# Patient Record
Sex: Female | Born: 1980
Health system: Southern US, Community
[De-identification: ages and names within clinical notes are randomized; demographics above are authoritative.]

## PROBLEM LIST (undated history)

## (undated) DIAGNOSIS — G7000 Myasthenia gravis without (acute) exacerbation: Secondary | ICD-10-CM

## (undated) DIAGNOSIS — K219 Gastro-esophageal reflux disease without esophagitis: Secondary | ICD-10-CM

## (undated) HISTORY — DX: Myasthenia gravis without (acute) exacerbation: G70.00

---
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
## 2006-10-11 ENCOUNTER — Emergency Department: Payer: Self-pay | Admitting: Emergency Medicine

## 2010-06-10 ENCOUNTER — Ambulatory Visit: Payer: Self-pay | Admitting: Obstetrics and Gynecology

## 2011-11-22 DIAGNOSIS — Z1159 Encounter for screening for other viral diseases: Secondary | ICD-10-CM | POA: Diagnosis not present

## 2011-11-22 DIAGNOSIS — Z113 Encounter for screening for infections with a predominantly sexual mode of transmission: Secondary | ICD-10-CM | POA: Diagnosis not present

## 2012-03-01 DIAGNOSIS — Z113 Encounter for screening for infections with a predominantly sexual mode of transmission: Secondary | ICD-10-CM | POA: Diagnosis not present

## 2012-10-15 DIAGNOSIS — R300 Dysuria: Secondary | ICD-10-CM | POA: Diagnosis not present

## 2012-10-15 DIAGNOSIS — L259 Unspecified contact dermatitis, unspecified cause: Secondary | ICD-10-CM | POA: Diagnosis not present

## 2013-04-10 DIAGNOSIS — Z113 Encounter for screening for infections with a predominantly sexual mode of transmission: Secondary | ICD-10-CM | POA: Diagnosis not present

## 2013-06-10 DIAGNOSIS — Z23 Encounter for immunization: Secondary | ICD-10-CM | POA: Diagnosis not present

## 2013-06-10 DIAGNOSIS — E559 Vitamin D deficiency, unspecified: Secondary | ICD-10-CM | POA: Diagnosis not present

## 2013-06-10 DIAGNOSIS — E049 Nontoxic goiter, unspecified: Secondary | ICD-10-CM | POA: Diagnosis not present

## 2013-06-10 DIAGNOSIS — Z131 Encounter for screening for diabetes mellitus: Secondary | ICD-10-CM | POA: Diagnosis not present

## 2013-06-10 DIAGNOSIS — G7000 Myasthenia gravis without (acute) exacerbation: Secondary | ICD-10-CM | POA: Diagnosis not present

## 2013-06-10 DIAGNOSIS — E785 Hyperlipidemia, unspecified: Secondary | ICD-10-CM | POA: Diagnosis not present

## 2014-05-20 DIAGNOSIS — Z713 Dietary counseling and surveillance: Secondary | ICD-10-CM | POA: Diagnosis not present

## 2014-05-20 DIAGNOSIS — Z23 Encounter for immunization: Secondary | ICD-10-CM | POA: Diagnosis not present

## 2014-05-20 DIAGNOSIS — E669 Obesity, unspecified: Secondary | ICD-10-CM | POA: Diagnosis not present

## 2014-05-20 DIAGNOSIS — L259 Unspecified contact dermatitis, unspecified cause: Secondary | ICD-10-CM | POA: Diagnosis not present

## 2014-08-25 DIAGNOSIS — Z114 Encounter for screening for human immunodeficiency virus [HIV]: Secondary | ICD-10-CM | POA: Diagnosis not present

## 2014-08-25 DIAGNOSIS — Z01419 Encounter for gynecological examination (general) (routine) without abnormal findings: Secondary | ICD-10-CM | POA: Diagnosis not present

## 2014-08-25 DIAGNOSIS — Z3042 Encounter for surveillance of injectable contraceptive: Secondary | ICD-10-CM | POA: Diagnosis not present

## 2014-08-25 DIAGNOSIS — Z3009 Encounter for other general counseling and advice on contraception: Secondary | ICD-10-CM | POA: Diagnosis not present

## 2015-03-16 DIAGNOSIS — Z3009 Encounter for other general counseling and advice on contraception: Secondary | ICD-10-CM | POA: Diagnosis not present

## 2015-03-16 DIAGNOSIS — Z114 Encounter for screening for human immunodeficiency virus [HIV]: Secondary | ICD-10-CM | POA: Diagnosis not present

## 2016-01-10 DIAGNOSIS — E669 Obesity, unspecified: Secondary | ICD-10-CM | POA: Insufficient documentation

## 2016-07-26 DIAGNOSIS — Z62810 Personal history of physical and sexual abuse in childhood: Secondary | ICD-10-CM

## 2016-07-26 HISTORY — DX: Personal history of physical and sexual abuse in childhood: Z62.810

## 2016-08-08 ENCOUNTER — Ambulatory Visit: Payer: Self-pay | Admitting: Family Medicine

## 2016-08-14 ENCOUNTER — Ambulatory Visit: Payer: Self-pay | Admitting: Family Medicine

## 2016-09-06 ENCOUNTER — Encounter: Payer: Self-pay | Admitting: Family Medicine

## 2016-09-06 ENCOUNTER — Ambulatory Visit (INDEPENDENT_AMBULATORY_CARE_PROVIDER_SITE_OTHER): Payer: Medicaid Other | Admitting: Family Medicine

## 2016-09-07 NOTE — Progress Notes (Signed)
Insurance not valid, patient was not seen

## 2016-10-10 ENCOUNTER — Encounter: Payer: Self-pay | Admitting: Family Medicine

## 2016-10-10 ENCOUNTER — Ambulatory Visit (INDEPENDENT_AMBULATORY_CARE_PROVIDER_SITE_OTHER): Payer: Medicare Other | Admitting: Family Medicine

## 2016-10-10 VITALS — BP 122/74 | HR 94 | Temp 98.3°F | Resp 16 | Ht 64.25 in | Wt 214.7 lb

## 2016-10-10 DIAGNOSIS — G7000 Myasthenia gravis without (acute) exacerbation: Secondary | ICD-10-CM | POA: Insufficient documentation

## 2016-10-10 DIAGNOSIS — M2142 Flat foot [pes planus] (acquired), left foot: Secondary | ICD-10-CM | POA: Diagnosis not present

## 2016-10-10 DIAGNOSIS — L308 Other specified dermatitis: Secondary | ICD-10-CM | POA: Diagnosis not present

## 2016-10-10 DIAGNOSIS — L309 Dermatitis, unspecified: Secondary | ICD-10-CM | POA: Insufficient documentation

## 2016-10-10 DIAGNOSIS — M2141 Flat foot [pes planus] (acquired), right foot: Secondary | ICD-10-CM

## 2016-10-10 DIAGNOSIS — M79672 Pain in left foot: Secondary | ICD-10-CM

## 2016-10-10 DIAGNOSIS — M79671 Pain in right foot: Secondary | ICD-10-CM | POA: Diagnosis not present

## 2016-10-10 MED ORDER — TRIAMCINOLONE ACETONIDE 0.1 % EX CREA: 1.0000 "application " | TOPICAL_CREAM | Freq: Two times a day (BID) | CUTANEOUS | 0 refills | Status: DC

## 2016-10-10 NOTE — Progress Notes (Signed)
Name: Raven Ray   MRN: [id number]    DOB: Aug 20, 1981   Date:10/10/2016       Progress Note  Subjective  Chief Complaint  Chief Complaint  Patient presents with  . Rash    Patient needs refill on Triamcinolone Acetonide due to sensitive skin and will help her rash go away.   . Nose Problem    Onset- 3 days, right nostril has a sore that is tender and feels like it blocks her nasal cannel and wanted looked on. Patient has had a fever and head cold and seems to get these sores every year.  . Pruritis    Inbetween bilateral thigh, itchy and has a dark spot on them.    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   Myasthenia: she is doing well, still gets tired at the end of the day, and not working full time, she is trying to work about 6 hours per day.   Nose scab: she states that every time she has a cold, she develops a scab on right nostril, it clears with vaseline. She had a cold, but symptoms are better since she took mucinex.    Patient Active Problem List   Diagnosis Date Noted  . Myasthenia gravis (HCC) 10/10/2016  . Eczema 10/10/2016    Past Surgical History:  Procedure Laterality Date  . CESAREAN SECTION  10/10/1999  . TOTAL THYMECTOMY  1991    Family History  Problem Relation Age of Onset  . Hypertension Mother   . Hypertension Father   . Diabetes Maternal Grandmother     Social History   Social History  . Marital status: Single    Spouse name: N/A  . Number of children: 2  . Years of education: N/A   Occupational History  . Not on file.   Social History Main Topics  . Smoking status: Never Smoker  . Smokeless tobacco: Never Used  . Alcohol use No  . Drug use: No  . Sexual activity: No   Other Topics Concern  . Not on file   Social History Narrative   She is on disability but she states she went back to work part time to not feel disabled      Current Outpatient Prescriptions:  .  triamcinolone cream (KENALOG) 0.1 %, Apply 1 application topically 2 (two) times daily., Disp: 453.6 g, Rfl: 0  No Known Allergies   ROS  Ten systems reviewed and is negative except as mentioned in HPI   Objective  Vitals:   10/10/16 1147  BP: 122/74  Pulse: 94  Resp: 16  Temp: 98.3 F (36.8 C)  TempSrc: Oral  SpO2: 98%  Weight: 214 lb 11.2 oz (97.4 kg)  Height: 5' 4.25" (1.632 m)    Body mass index is 36.57 kg/m.  Physical Exam  Constitutional: Patient appears well-developed and well-nourished. Obese  No distress.  HEENT: head atraumatic, normocephalic, pupils equal and reactive to light,neck supple, throat within normal limits, nose shows a small scab on right outer nostril Cardiovascular: Normal rate, regular rhythm and normal heart sounds.  No murmur heard. No BLE edema. Pulmonary/Chest: Effort normal and breath sounds normal. No respiratory distress. Abdominal: Soft.  There is no tenderness. Psychiatric: Patient has a normal mood and affect. behavior is normal. Judgment and thought content normal.  PHQ2/9: [facility] 09/06/2016  Decreased  Interest 0  Down, Depressed, Hopeless 0  PHQ - 2 Score 0     Fall Risk: Fall Risk  09/06/2016  Falls in the past year? No    Assessment & Plan  1. Flat feet, bilateral  - Ambulatory referral to Podiatry  2. Bilateral foot pain  - Ambulatory referral to Podiatry  3. Myasthenia gravis [facility])  Not seeing neurologist at this time  4. Other eczema  She needs to resume moisturizer.  - triamcinolone cream (KENALOG) 0.1 %; Apply 1 application topically 2 (two) times daily.  Dispense: 453.6 g; Refill: 0

## 2016-10-20 ENCOUNTER — Encounter: Payer: Self-pay | Admitting: Family Medicine

## 2016-10-20 ENCOUNTER — Ambulatory Visit
Admission: RE | Admit: 2016-10-20 | Discharge: 2016-10-20 | Disposition: A | Discharge status: Home / Self Care | Payer: Medicare Other | Source: Ambulatory Visit | Attending: Family Medicine | Admitting: Family Medicine

## 2016-10-20 ENCOUNTER — Ambulatory Visit (INDEPENDENT_AMBULATORY_CARE_PROVIDER_SITE_OTHER): Payer: Medicare Other | Admitting: Family Medicine

## 2016-10-20 VITALS — BP 114/76 | HR 106 | Temp 98.8°F | Resp 18 | Ht 64.0 in | Wt 212.6 lb

## 2016-10-20 DIAGNOSIS — R197 Diarrhea, unspecified: Secondary | ICD-10-CM

## 2016-10-20 DIAGNOSIS — R109 Unspecified abdominal pain: Secondary | ICD-10-CM | POA: Diagnosis not present

## 2016-10-20 DIAGNOSIS — R112 Nausea with vomiting, unspecified: Secondary | ICD-10-CM

## 2016-10-20 DIAGNOSIS — R1031 Right lower quadrant pain: Secondary | ICD-10-CM

## 2016-10-20 DIAGNOSIS — R1033 Periumbilical pain: Secondary | ICD-10-CM | POA: Diagnosis not present

## 2016-10-20 DIAGNOSIS — R1110 Vomiting, unspecified: Secondary | ICD-10-CM | POA: Diagnosis not present

## 2016-10-20 LAB — POCT URINE PREGNANCY: Preg Test, Ur: NEGATIVE

## 2016-10-20 MED ORDER — IOPAMIDOL (ISOVUE-300) INJECTION 61%
100.0000 mL | Freq: Once | INTRAVENOUS | Status: AC | PRN
  Administered 2016-10-20: 100 mL via INTRAVENOUS

## 2016-10-20 MED ORDER — HYOSCYAMINE SULFATE 0.125 MG PO TABS: 0.1250 mg | ORAL_TABLET | ORAL | 0 refills | Status: DC | PRN

## 2016-10-20 MED ORDER — ONDANSETRON HCL 4 MG PO TABS: 4.0000 mg | ORAL_TABLET | Freq: Three times a day (TID) | ORAL | 0 refills | Status: DC | PRN

## 2016-10-20 NOTE — Progress Notes (Signed)
Name: Raven Ray   MRN: [id number]    DOB: [birth year]   Date:10/20/2016       Progress Note  Subjective  Chief Complaint  Chief Complaint  Patient presents with  . 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 Emesis    Abdominal pain in the middle region-sharp, fatigue, bloated.     HPI  N/V/D: she was at work last night ( [employer]) and developed acute onset of peri-umbilical pain, bloating, followed by vomiting, nausea ( any smell of food was making her nausea ) and multiple episodes of watery stools, at least every 10 minutes during the night. Foul odor. Also had chills last night. Vomiting has resolved, diarrhea is slowing down, she has no appetite for the past 36 hours, and has no desire to drink fluids. Last meal was 2 days ago. She still has her gallbladder and appendix. Pain is 7/10, but was worse last night   Patient Active Problem List   Diagnosis Date Noted  . Myasthenia gravis (HCC) 10/10/2016  . Eczema 10/10/2016    Past Surgical History:  Procedure Laterality Date  . CESAREAN SECTION  10/10/1999  . TOTAL THYMECTOMY  1991    Family History  Problem Relation Age of Onset  . Hypertension Mother   . Hypertension Father   . Diabetes Maternal Grandmother     Social History   Social History  . Marital status: Single    Spouse name: N/A  . Number of children: 2  . Years of education: N/A   Occupational History  . Not on file.   Social History Main Topics  . Smoking status: Never Smoker  . Smokeless tobacco: Never Used  . Alcohol use No  . Drug use: No  . Sexual activity: No   Other Topics Concern  . Not on file   Social History Narrative   She is on disability but she states she went back to work part time to not feel disabled     Current Outpatient Prescriptions:  .  triamcinolone cream (KENALOG) 0.1 %, Apply 1 application topically 2 (two) times daily., Disp: 453.6 g, Rfl: 0  No Known  Allergies   ROS  Ten systems reviewed and is negative except as mentioned in HPI   Objective  Vitals:   10/20/16 1434  BP: 114/76  Pulse: (!) 106  Resp: 18  Temp: 98.8 F (37.1 C)  TempSrc: Oral  SpO2: 98%  Weight: 212 lb 9.6 oz (96.4 kg)  Height: 5\' 4"  (1.626 m)    Body mass index is 36.49 kg/m.  Physical Exam  Constitutional: Patient appears well-developed and well-nourished. Obese No distress.  HEENT: head atraumatic, normocephalic, pupils equal and reactive to light,neck supple, throat within normal limits Cardiovascular: Normal rate, regular rhythm and normal heart sounds.  No murmur heard. No BLE edema. Pulmonary/Chest: Effort normal and breath sounds normal. No respiratory distress. Abdominal: Soft.  There is peri-umbilical and RLQ pain, psoas sign positive, decrease bowel sounds Psychiatric: Patient has a normal mood and affect. behavior is normal. Judgment and thought content normal.  PHQ2/9: Depression screen PHQ 2/9 09/06/2016  Decreased Interest 0  Down, Depressed, Hopeless 0  PHQ - 2 Score 0     Fall Risk: Fall Risk  09/06/2016  Falls in the past year? No      Assessment & Plan  1. Nausea, vomiting, and diarrhea  - CT Abdomen Pelvis Wo Contrast; Future  2. Umbilical pain  - CT Abdomen Pelvis Wo Contrast;  Discussed with patient chance of appendicitis and we got CT done, negative CT, we will call in Levsin and Zofran to her pharmacy - patient notified of results by phone  3. Right lower quadrant pain  - CT Abdomen Pelvis Wo Contrast; Future

## 2016-10-23 ENCOUNTER — Telehealth: Payer: Self-pay

## 2016-10-23 NOTE — Telephone Encounter (Signed)
Patient pharmacy states Hyoscyamine Sulf is not on her formulary list, please change medication.

## 2016-10-24 NOTE — Telephone Encounter (Signed)
Unfortunately no other option, see how much it cost cash. It is generic

## 2016-10-24 NOTE — Telephone Encounter (Signed)
Spoke with Neysa Bonitohristy at Pharmacy and they state it will not go thru due to patient having Medicare Part D. Patient is going to call case worker to see why her Medicaid is not working for medication. ID # [id number]

## 2016-10-31 ENCOUNTER — Ambulatory Visit: Payer: Self-pay | Admitting: Podiatry

## 2016-11-06 ENCOUNTER — Encounter: Payer: Self-pay | Admitting: Podiatry

## 2016-11-06 ENCOUNTER — Ambulatory Visit: Payer: Self-pay

## 2016-11-06 DIAGNOSIS — M2141 Flat foot [pes planus] (acquired), right foot: Secondary | ICD-10-CM

## 2016-11-06 DIAGNOSIS — M2142 Flat foot [pes planus] (acquired), left foot: Principal | ICD-10-CM

## 2016-11-06 NOTE — Progress Notes (Signed)
This encounter was created in error - please disregard.

## 2017-01-23 ENCOUNTER — Ambulatory Visit (INDEPENDENT_AMBULATORY_CARE_PROVIDER_SITE_OTHER): Payer: Medicare Other | Admitting: Podiatry

## 2017-01-23 ENCOUNTER — Ambulatory Visit (INDEPENDENT_AMBULATORY_CARE_PROVIDER_SITE_OTHER): Payer: Medicare Other

## 2017-01-23 ENCOUNTER — Encounter: Payer: Self-pay | Admitting: Podiatry

## 2017-01-23 DIAGNOSIS — M2142 Flat foot [pes planus] (acquired), left foot: Secondary | ICD-10-CM | POA: Diagnosis not present

## 2017-01-23 DIAGNOSIS — M79671 Pain in right foot: Secondary | ICD-10-CM

## 2017-01-23 DIAGNOSIS — M722 Plantar fascial fibromatosis: Secondary | ICD-10-CM | POA: Diagnosis not present

## 2017-01-23 DIAGNOSIS — M79673 Pain in unspecified foot: Secondary | ICD-10-CM

## 2017-01-23 DIAGNOSIS — M2141 Flat foot [pes planus] (acquired), right foot: Secondary | ICD-10-CM

## 2017-01-23 DIAGNOSIS — M79672 Pain in left foot: Secondary | ICD-10-CM

## 2017-01-23 MED ORDER — MELOXICAM 15 MG PO TABS: 15.0000 mg | ORAL_TABLET | Freq: Every day | ORAL | 1 refills | Status: AC

## 2017-01-24 NOTE — Progress Notes (Signed)
   Subjective: Patient presents today for aching, throbbing pain and tenderness in the feet bilaterally that began about four months ago. 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 She reports being diagnosed with flat feet by her PCP. Patient states that it hurts in the mornings with the first steps out of bed. Walking and standing also increase her pain. She has been wearing OTC arch supports with minimal relief. Patient presents today for further treatment and evaluation  Objective: Physical Exam General: The patient is alert and oriented x3 in no acute distress.  Dermatology: Skin is warm, dry and supple bilateral lower extremities. Negative for open lesions or macerations bilateral.   Vascular: Dorsalis Pedis and Posterior Tibial pulses palpable bilateral.  Capillary fill time is immediate to all digits.  Neurological: Epicritic and protective threshold intact bilateral.   Musculoskeletal: Tenderness to palpation at the medial calcaneal tubercale and through the insertion of the plantar fascia of the bilateral feet. All other joints range of motion within normal limits bilateral. Strength 5/5 in all groups bilateral.   Radiographic exam: Normal osseous mineralization. Joint spaces preserved. No fracture/dislocation/boney destruction. Calcaneal spur present with mild thickening of plantar fascia bilateral. No other soft tissue abnormalities or radiopaque foreign bodies.   Assessment: #1 plantar fasciitis bilateral feet #2 pain in bilateral feet  Plan of Care:  1. Patient evaluated. Xrays reviewed.   2. Injection of 0.5cc Celestone soluspan injected into the bilateral heels. (medicaid) 3. Instructed patient regarding therapies and modalities at home to alleviate symptoms.  4. Recommended shoes and inserts from [employer]. 5. Return to clinic in 4 weeks.    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  Felecia Shelling, DPM [facility]  Dr. Felecia Shelling, DPM      [address]                                        [place], [place] [address]                Office [phone]  Fax [phone]

## 2017-01-28 MED ORDER — BETAMETHASONE SOD PHOS & ACET 6 (3-3) MG/ML IJ SUSP: 3.0000 mg | Freq: Once | INTRAMUSCULAR | Status: DC

## 2017-02-20 ENCOUNTER — Ambulatory Visit (INDEPENDENT_AMBULATORY_CARE_PROVIDER_SITE_OTHER): Payer: Medicare Other | Admitting: Podiatry

## 2017-02-20 ENCOUNTER — Encounter: Payer: Self-pay | Admitting: Podiatry

## 2017-02-20 DIAGNOSIS — M722 Plantar fascial fibromatosis: Secondary | ICD-10-CM

## 2017-02-21 NOTE — Progress Notes (Signed)
   Subjective: Patient presents today for follow-up evaluation of bilateral plantar fasciitis. She states the pain is significantly better but is still experiencing mild pain in bilateral arches. The injections and inserts have helped alleviate the pain.  Objective: Physical Exam General: The patient is alert and oriented x3 in no acute distress.  Dermatology: Skin is warm, dry and supple bilateral lower extremities. Negative for open lesions or macerations bilateral.   Vascular: Dorsalis Pedis and Posterior Tibial pulses palpable bilateral.  Capillary fill time is immediate to all digits.  Neurological: Epicritic and protective threshold intact bilateral.   Musculoskeletal: Tenderness to palpation at the medial calcaneal tubercale and through the insertion of the plantar fascia of the bilateral feet. All other joints range of motion within normal limits bilateral. Strength 5/5 in all groups bilateral.     Assessment: #1 plantar fasciitis bilateral feet-mid arch #2 pain in bilateral feet  Plan of Care:  1. Patient evaluated.  2. Injection of 0.5cc Celestone soluspan injected into the bilateral heels. (medicaid) 3. Instructed patient regarding therapies and modalities at home to alleviate symptoms.  4. Continue wearing new shoes, OTC insoles and taking meloxicam 15 mg. 5. Return to clinic in 4 weeks.    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  Felecia ShellingBrent M. Aylanie Cubillos, DPM [facility]  Dr. Felecia ShellingBrent M. Shere Eisenhart, DPM    [address]                                        [place], [place] [address]                Office [phone]  Fax [phone]

## 2017-02-23 MED ORDER — BETAMETHASONE SOD PHOS & ACET 6 (3-3) MG/ML IJ SUSP: 3.0000 mg | Freq: Once | INTRAMUSCULAR | Status: DC

## 2017-03-20 ENCOUNTER — Ambulatory Visit: Payer: Medicare Other | Admitting: Podiatry

## 2017-05-04 ENCOUNTER — Encounter: Payer: Medicare Other | Admitting: Family Medicine

## 2017-06-11 ENCOUNTER — Ambulatory Visit (INDEPENDENT_AMBULATORY_CARE_PROVIDER_SITE_OTHER): Payer: Medicare Other

## 2017-06-11 VITALS — BP 116/68 | HR 76 | Temp 100.0°F | Ht 64.0 in | Wt 209.5 lb

## 2017-06-11 DIAGNOSIS — Z0000 Encounter for general adult medical examination without abnormal findings: Secondary | ICD-10-CM

## 2017-06-11 DIAGNOSIS — Z114 Encounter for screening for human immunodeficiency virus [HIV]: Secondary | ICD-10-CM

## 2017-06-11 NOTE — Progress Notes (Signed)
Subjective:   Raven Ray is a 36 y.o. female who presents for an Initial Medicare Annual Wellness Visit.  Review of Systems    N/A  Cardiac Risk Factors include: obesity (BMI >30kg/m2);dyslipidemia     Objective:    Today's Vitals   06/11/17 1047  BP: 116/68  Pulse: 76  Temp: 100 F (37.8 C)  TempSrc: Oral  Weight: 209 lb 8 oz (95 kg)  Height: 5\' 4"  (1.626 m)  PainSc: 0-No pain   Body mass index is 35.96 kg/m.   Current Medications (verified) Outpatient Encounter Prescriptions as of 06/11/2017  Medication Sig  . triamcinolone cream (KENALOG) 0.1 % Apply 1 application topically 2 (two) times daily. (Patient taking differently: Apply 1 application topically 2 (two) times daily. )  . [DISCONTINUED] hyoscyamine (LEVSIN, ANASPAZ) 0.125 MG tablet Take 1 tablet (0.125 mg total) by mouth every 4 (four) hours as needed.   Facility-Administered Encounter Medications as of 06/11/2017  Medication  . betamethasone acetate-betamethasone sodium phosphate (CELESTONE) injection 3 mg  . betamethasone acetate-betamethasone sodium phosphate (CELESTONE) injection 3 mg    Allergies (verified) Patient has no known allergies.   History: Past Medical History:  Diagnosis Date  . Myasthenia gravis [facility](HCC)    Past Surgical History:  Procedure Laterality Date  . CESAREAN SECTION  10/10/1999  . TOTAL THYMECTOMY  1991   Family History  Problem Relation Age of Onset  . Hypertension Mother   . Hypertension Father   . Diabetes Maternal Grandmother    Social History   Occupational History  . Not on file.   Social History Main Topics  . Smoking status: Never Smoker  . Smokeless tobacco: Never Used  . Alcohol use No  . Drug use: No  . Sexual activity: No    Tobacco Counseling Counseling given: Not Answered   Activities of Daily Living In your present state of health, do you have any difficulty performing the following activities: 06/11/2017 09/06/2016  Hearing? N N  Vision?  N N  Difficulty concentrating or making decisions? N N  Walking or climbing stairs? N N  Dressing or bathing? N N  Doing errands, shopping? N N  Preparing Food and eating ? N -  Using the Toilet? N -  In the past six months, have you accidently leaked urine? N -  Do you have problems with loss of bowel control? N -  Managing your Medications? N -  Managing your Finances? N -  Housekeeping or managing your Housekeeping? N -  Some recent data might be hidden    Immunizations and Health Maintenance  There is no immunization history on file for this patient. Health Maintenance Due  Topic Date Due  . HIV Screening  06/15/1995  . PAP SMEAR  06/14/2001  . INFLUENZA VACCINE  05/02/2017    Patient Care Team: Alba CorySowles, Krichna, MD as PCP - General (Family Medicine)  Indicate any recent Medical Services you may have received from other than [facility] providers in the past year (date may be approximate).     Assessment:   This is a routine wellness examination for Raven Ray.   Hearing/Vision screen Vision Screening Comments: [facility] every 2 years.   Dietary issues and exercise activities discussed: Current Exercise Habits: The patient does not participate in regular exercise at present, Exercise limited by: Other - see comments (working a lot and feels tired from work)  Tour managerGoals    . Exercise 3x per week (30  min per time)          Recommend increasing exercise to 3 times a week for at least 30 minutes.       Depression Screen PHQ 2/9 Scores 06/11/2017 09/06/2016  PHQ - 2 Score 0 0    Fall Risk Fall Risk  06/11/2017 09/06/2016  Falls in the past year? No No    Cognitive Function: Pt declined screening today.        Screening Tests Health Maintenance  Topic Date Due  . HIV Screening  06/15/1995  . PAP SMEAR  06/14/2001  . INFLUENZA VACCINE  05/02/2017  . TETANUS/TDAP  06/11/2023      Plan:  I have personally reviewed and addressed the  Medicare Annual Wellness questionnaire and have noted the following in the patient's chart:  A. Medical and social history B. Use of alcohol, tobacco or illicit drugs  C. Current medications and supplements D. Functional ability and status E.  Nutritional status F.  Physical activity G. Advance directives H. List of other physicians I.  Hospitalizations, surgeries, and ER visits in previous 12 months J.  Vitals K. Screenings such as hearing and vision if needed, cognitive and depression L. Referrals and appointments - none  In addition, I have reviewed and discussed with patient certain preventive protocols, quality metrics, and best practice recommendations. A written personalized care plan for preventive services as well as general preventive health recommendations were provided to patient.  See attached scanned questionnaire for additional information.   Signed,  Hyacinth Meeker, LPN Nurse Health Advisor   MD Recommendations: Pt needs a pap smear at next physical. Pt declined influenza vaccine today and will receive this at a later time.   I have reviewed this encounter including the documentation in this note and/or discussed this patient with the provider, Hyacinth Meeker, LPNI am certifying that I agree with the content of this note as supervising physician.  Alba Cory, MD [facility] 06/11/2017, 4:00 PM

## 2017-06-11 NOTE — Patient Instructions (Addendum)
[name] , Thank you for taking time to come for your Medicare Wellness Visit. I appreciate your ongoing commitment to your health goals. Please review the following plan we discussed and let me know if I can assist you in the future.   Screening recommendations/referrals: Colonoscopy: N/A Mammogram: N/A Bone Density: N/A Recommended yearly ophthalmology/optometry visit for glaucoma screening and checkup Recommended yearly dental visit for hygiene and checkup  Vaccinations: Influenza vaccine: declined today Pneumococcal vaccine: N/A Tdap vaccine: up to date, due 06/2023 Shingles vaccine: N/A  Advanced directives: Advance directive discussed with you today. Please bring the proper paperwork (once completed) to your next appointment for [place] to scan into your chart.  Conditions/risks identified: Recommend increasing exercise to 3 times a week for at least 30 minutes.   Next appointment: 06/15/17  Preventive Care 40-64 Years, Female Preventive care refers to lifestyle choices and visits with your health care provider that can promote health and wellness. What does preventive care include?  A yearly physical exam. This is also called an annual well check.  Dental exams once or twice a year.  Routine eye exams. Ask your health care provider how often you should have your eyes checked.  Personal lifestyle choices, including:  Daily care of your teeth and gums.  Regular physical activity.  Eating a healthy diet.  Avoiding tobacco and drug use.  Limiting alcohol use.  Practicing safe sex.  Taking low-dose aspirin daily starting at age 40.  Taking vitamin and mineral supplements as recommended by your health care provider. What happens during an annual well check? The services and screenings done by your health care provider during your annual well check will depend on your age, overall health, lifestyle risk factors, and family history of disease. Counseling  Your health  care provider may ask you questions about your:  Alcohol use.  Tobacco use.  Drug use.  Emotional well-being.  Home and relationship well-being.  Sexual activity.  Eating habits.  Work and work Statistician.  Method of birth control.  Menstrual cycle.  Pregnancy history. Screening  You may have the following tests or measurements:  Height, weight, and BMI.  Blood pressure.  Lipid and cholesterol levels. These may be checked every 5 years, or more frequently if you are over 19 years old.  Skin check.  Lung cancer screening. You may have this screening every year starting at age 75 if you have a 30-pack-year history of smoking and currently smoke or have quit within the past 15 years.  Fecal occult blood test (FOBT) of the stool. You may have this test every year starting at age 31.  Flexible sigmoidoscopy or colonoscopy. You may have a sigmoidoscopy every 5 years or a colonoscopy every 10 years starting at age 53.  Hepatitis C blood test.  Hepatitis B blood test.  Sexually transmitted disease (STD) testing.  Diabetes screening. This is done by checking your blood sugar (glucose) after you have not eaten for a while (fasting). You may have this done every 1-3 years.  Mammogram. This may be done every 1-2 years. Talk to your health care provider about when you should start having regular mammograms. This may depend on whether you have a family history of breast cancer.  BRCA-related cancer screening. This may be done if you have a family history of breast, ovarian, tubal, or peritoneal cancers.  Pelvic exam and Pap test. This may be done every 3 years starting at age 77. Starting at age 64, this may be done  every 5 years if you have a Pap test in combination with an HPV test.  Bone density scan. This is done to screen for osteoporosis. You may have this scan if you are at high risk for osteoporosis. Discuss your test results, treatment options, and if necessary, the  need for more tests with your health care provider. Vaccines  Your health care provider may recommend certain vaccines, such as:  Influenza vaccine. This is recommended every year.  Tetanus, diphtheria, and acellular pertussis (Tdap, Td) vaccine. You may need a Td booster every 10 years.  Zoster vaccine. You may need this after age 31.  Pneumococcal 13-valent conjugate (PCV13) vaccine. You may need this if you have certain conditions and were not previously vaccinated.  Pneumococcal polysaccharide (PPSV23) vaccine. You may need one or two doses if you smoke cigarettes or if you have certain conditions. Talk to your health care provider about which screenings and vaccines you need and how often you need them. This information is not intended to replace advice given to you by your health care provider. Make sure you discuss any questions you have with your health care provider. Document Released: 10/15/2015 Document Revised: 06/07/2016 Document Reviewed: 07/20/2015 Elsevier Interactive Patient Education  2017 [employer] Prevention in the Home Falls can cause injuries. They can happen to people of all ages. There are many things you can do to make your home safe and to help prevent falls. What can I do on the outside of my home?  Regularly fix the edges of walkways and driveways and fix any cracks.  Remove anything that might make you trip as you walk through a door, such as a raised step or threshold.  Trim any bushes or trees on the path to your home.  Use bright outdoor lighting.  Clear any walking paths of anything that might make someone trip, such as rocks or tools.  Regularly check to see if handrails are loose or broken. Make sure that both sides of any steps have handrails.  Any raised decks and porches should have guardrails on the edges.  Have any leaves, snow, or ice cleared regularly.  Use sand or salt on walking paths during winter.  Clean up any  spills in your garage right away. This includes oil or grease spills. What can I do in the bathroom?  Use night lights.  Install grab bars by the toilet and in the tub and shower. Do not use towel bars as grab bars.  Use non-skid mats or decals in the tub or shower.  If you need to sit down in the shower, use a plastic, non-slip stool.  Keep the floor dry. Clean up any water that spills on the floor as soon as it happens.  Remove soap buildup in the tub or shower regularly.  Attach bath mats securely with double-sided non-slip rug tape.  Do not have throw rugs and other things on the floor that can make you trip. What can I do in the bedroom?  Use night lights.  Make sure that you have a light by your bed that is easy to reach.  Do not use any sheets or blankets that are too big for your bed. They should not hang down onto the floor.  Have a firm chair that has side arms. You can use this for support while you get dressed.  Do not have throw rugs and other things on the floor that can make you trip. What can  I do in the kitchen?  Clean up any spills right away.  Avoid walking on wet floors.  Keep items that you use a lot in easy-to-reach places.  If you need to reach something above you, use a strong step stool that has a grab bar.  Keep electrical cords out of the way.  Do not use floor polish or wax that makes floors slippery. If you must use wax, use non-skid floor wax.  Do not have throw rugs and other things on the floor that can make you trip. What can I do with my stairs?  Do not leave any items on the stairs.  Make sure that there are handrails on both sides of the stairs and use them. Fix handrails that are broken or loose. Make sure that handrails are as long as the stairways.  Check any carpeting to make sure that it is firmly attached to the stairs. Fix any carpet that is loose or worn.  Avoid having throw rugs at the top or bottom of the stairs. If you  do have throw rugs, attach them to the floor with carpet tape.  Make sure that you have a light switch at the top of the stairs and the bottom of the stairs. If you do not have them, ask someone to add them for you. What else can I do to help prevent falls?  Wear shoes that:  Do not have high heels.  Have rubber bottoms.  Are comfortable and fit you well.  Are closed at the toe. Do not wear sandals.  If you use a stepladder:  Make sure that it is fully opened. Do not climb a closed stepladder.  Make sure that both sides of the stepladder are locked into place.  Ask someone to hold it for you, if possible.  Clearly mark and make sure that you can see:  Any grab bars or handrails.  First and last steps.  Where the edge of each step is.  Use tools that help you move around (mobility aids) if they are needed. These include:  Canes.  Walkers.  Scooters.  Crutches.  Turn on the lights when you go into a dark area. Replace any light bulbs as soon as they burn out.  Set up your furniture so you have a clear path. Avoid moving your furniture around.  If any of your floors are uneven, fix them.  If there are any pets around you, be aware of where they are.  Review your medicines with your doctor. Some medicines can make you feel dizzy. This can increase your chance of falling. Ask your doctor what other things that you can do to help prevent falls. This information is not intended to replace advice given to you by your health care provider. Make sure you discuss any questions you have with your health care provider. Document Released: 07/15/2009 Document Revised: 02/24/2016 Document Reviewed: 10/23/2014 Elsevier Interactive Patient Education  2017 [employer].

## 2017-06-12 LAB — HIV ANTIBODY (ROUTINE TESTING W REFLEX): HIV: NONREACTIVE

## 2017-06-14 ENCOUNTER — Encounter: Payer: Self-pay | Admitting: Family Medicine

## 2017-06-14 ENCOUNTER — Ambulatory Visit (INDEPENDENT_AMBULATORY_CARE_PROVIDER_SITE_OTHER): Payer: Medicare Other | Admitting: Family Medicine

## 2017-06-14 VITALS — BP 122/80 | HR 97 | Temp 99.1°F | Resp 16 | Ht 64.0 in | Wt 212.2 lb

## 2017-06-14 DIAGNOSIS — Z23 Encounter for immunization: Secondary | ICD-10-CM

## 2017-06-14 DIAGNOSIS — L308 Other specified dermatitis: Secondary | ICD-10-CM | POA: Diagnosis not present

## 2017-06-14 DIAGNOSIS — G7000 Myasthenia gravis without (acute) exacerbation: Secondary | ICD-10-CM

## 2017-06-14 NOTE — Progress Notes (Addendum)
Name: Raven Ray   MRN: [id number]    DOB: 1981-07-22   Date:06/14/2017       Progress Note  Subjective  Chief Complaint  Chief Complaint  Patient presents with  . Referral    neurology, myasthenia gravis    HPI  Myasthenia Gravis: she had double vision as a child, diagnosed with Myasthenia Gravis at [facility] as an infant, had thymectomy at age 36 yo, she took medication in the past but does not like side effects. She states it makes her feel too up beat, cannot take naps. She states she takes naps in the pm's, strabismus is much worse when tired, but recently developed double vision intermittently and would like to see Dr. Sherryll Burger ( local physician). She is still working part time - [employer] as a Production assistant, radio.   Eczema: she is doing well with topical triamcinolone and rash on arm is under control at this time  Patient Active Problem List   Diagnosis Date Noted  . Myasthenia gravis (HCC) 10/10/2016  . Eczema 10/10/2016    Past Surgical History:  Procedure Laterality Date  . CESAREAN SECTION  10/10/1999  . TOTAL THYMECTOMY  1991    Family History  Problem Relation Age of Onset  . Hypertension Mother   . Hypertension Father   . Diabetes Maternal Grandmother     Social History   Social History  . Marital status: Single    Spouse name: N/A  . Number of children: 2  . Years of education: N/A   Occupational History  . Not on file.   Social History Main Topics  . Smoking status: Never Smoker  . Smokeless tobacco: Never Used  . Alcohol use No  . Drug use: No  . Sexual activity: No   Other Topics Concern  . Not on file   Social History Narrative   She is on disability but she states she went back to work part time to not feel disabled     Current Outpatient Prescriptions:  .  triamcinolone cream (KENALOG) 0.1 %, Apply 1 application topically 2 (two) times daily. (Patient taking differently: Apply 1 application topically 2 (two) times daily. ), Disp: 453.6 g, Rfl: 0  No Known  Allergies   ROS  Constitutional: Negative for fever or weight change.  Respiratory: Negative for cough and shortness of breath.   Cardiovascular: Negative for chest pain or palpitations.  Gastrointestinal: Negative for abdominal pain, no bowel changes.  Musculoskeletal: Negative for gait problem or joint swelling.  Skin: Negative for rash.  Neurological: Negative for dizziness or headache.  No other specific complaints in a complete review of systems (except as listed in HPI above).  Objective  Vitals:   06/14/17 0911  BP: 122/80  Pulse: 97  Resp: 16  Temp: 99.1 F (37.3 C)  TempSrc: Oral  SpO2: 97%  Weight: 212 lb 3.2 oz (96.3 kg)  Height:  (1.626 m)    Body mass index is 36.42 kg/m.  Physical Exam  Constitutional: Patient appears well-developed and well-nourished. Obese No distress.  HEENT: head atraumatic, normocephalic, pupils equal and reactive to light,  neck supple, throat within normal limits, strabismus  Cardiovascular: Normal rate, regular rhythm and normal heart sounds.  No murmur heard. No BLE edema. Pulmonary/Chest: Effort normal and breath sounds normal. No respiratory distress. Abdominal: Soft.  There is no tenderness. Psychiatric: Patient has a normal mood and affect. behavior is normal. Judgment and thought content normal. Neurological: no focal findings.   Recent Results (  from the past 2160 hour(s))  HIV antibody     Status: None   Collection Time: 06/11/17 11:37 AM  Result Value Ref Range   HIV 1&2 Ab, 4th Generation NON-REACTIVE NON-REACTI    Comment: HIV-1 antigen and HIV-1/HIV-2 antibodies were not detected. There is no laboratory evidence of HIV infection. Marland Kitchen. PLEASE NOTE: This information has been disclosed to you from records whose confidentiality may be protected by state law.  If your state requires such protection, then the state law prohibits you from making any further disclosure of the information without the specific written  consent of the person to whom it pertains, or as otherwise permitted by law. A general authorization for the release of medical or other information is NOT sufficient for this purpose. . For additional information please refer to http://education.questdiagnostics.com/faq/FAQ106 (This link is being provided for informational/ educational purposes only.) . Marland Kitchen. The performance of this assay has not been clinically validated in patients less than 37 years old. .       PHQ2/9: [facility] 06/14/2017 06/11/2017 09/06/2016  Decreased Interest 0 0 0  Down, Depressed, Hopeless 0 0 0  PHQ - 2 Score 0 0 0     Fall Risk: Fall Risk  06/14/2017 06/11/2017 09/06/2016  Falls in the past year? No No No     Functional Status Survey: Is the patient deaf or have difficulty hearing?: No Does the patient have difficulty seeing, even when wearing glasses/contacts?: Yes Does the patient have difficulty concentrating, remembering, or making decisions?: No Does the patient have difficulty walking or climbing stairs?: No Does the patient have difficulty dressing or bathing?: No Does the patient have difficulty doing errands alone such as visiting a doctor's office or shopping?: No   Assessment & Plan  1. Myasthenia gravis (HCC)  Having double vision  - Ambulatory referral to Neurology  2. Needs flu shot  - Flu Vaccine QUAD 6+ mos PF IM (Fluarix Quad PF)  3. Other eczema  Using topical medication and is doing well

## 2017-06-15 ENCOUNTER — Ambulatory Visit: Payer: Medicare Other | Admitting: Family Medicine

## 2017-07-23 ENCOUNTER — Ambulatory Visit: Payer: Medicare Other | Admitting: Family Medicine

## 2017-08-07 DIAGNOSIS — R0683 Snoring: Secondary | ICD-10-CM | POA: Diagnosis not present

## 2017-08-07 DIAGNOSIS — R5381 Other malaise: Secondary | ICD-10-CM | POA: Diagnosis not present

## 2017-08-07 DIAGNOSIS — E538 Deficiency of other specified B group vitamins: Secondary | ICD-10-CM | POA: Diagnosis not present

## 2017-08-07 DIAGNOSIS — E559 Vitamin D deficiency, unspecified: Secondary | ICD-10-CM | POA: Diagnosis not present

## 2017-08-07 DIAGNOSIS — G7000 Myasthenia gravis without (acute) exacerbation: Secondary | ICD-10-CM | POA: Diagnosis not present

## 2017-08-07 DIAGNOSIS — R5383 Other fatigue: Secondary | ICD-10-CM | POA: Diagnosis not present

## 2017-08-10 ENCOUNTER — Other Ambulatory Visit: Payer: Self-pay | Admitting: Neurology

## 2017-08-10 DIAGNOSIS — G7000 Myasthenia gravis without (acute) exacerbation: Secondary | ICD-10-CM

## 2017-08-20 ENCOUNTER — Ambulatory Visit
Admission: RE | Admit: 2017-08-20 | Discharge: 2017-08-20 | Disposition: A | Discharge status: Home / Self Care | Payer: Medicare Other | Source: Ambulatory Visit | Attending: Neurology | Admitting: Neurology

## 2017-08-20 DIAGNOSIS — G7000 Myasthenia gravis without (acute) exacerbation: Secondary | ICD-10-CM | POA: Diagnosis not present

## 2017-08-20 DIAGNOSIS — R918 Other nonspecific abnormal finding of lung field: Secondary | ICD-10-CM | POA: Diagnosis not present

## 2017-08-29 ENCOUNTER — Ambulatory Visit: Payer: Medicare Other | Attending: Neurology

## 2017-08-29 DIAGNOSIS — R0683 Snoring: Secondary | ICD-10-CM | POA: Insufficient documentation

## 2017-08-29 DIAGNOSIS — G4730 Sleep apnea, unspecified: Secondary | ICD-10-CM | POA: Diagnosis not present

## 2017-09-04 ENCOUNTER — Encounter: Payer: Self-pay | Admitting: Podiatry

## 2017-09-04 ENCOUNTER — Ambulatory Visit (INDEPENDENT_AMBULATORY_CARE_PROVIDER_SITE_OTHER): Payer: Medicare Other

## 2017-09-04 ENCOUNTER — Other Ambulatory Visit: Payer: Self-pay | Admitting: Podiatry

## 2017-09-04 ENCOUNTER — Ambulatory Visit (INDEPENDENT_AMBULATORY_CARE_PROVIDER_SITE_OTHER): Payer: Medicare Other | Admitting: Podiatry

## 2017-09-04 DIAGNOSIS — M7752 Other enthesopathy of left foot: Secondary | ICD-10-CM | POA: Diagnosis not present

## 2017-09-04 DIAGNOSIS — M722 Plantar fascial fibromatosis: Secondary | ICD-10-CM

## 2017-09-04 DIAGNOSIS — M659 Synovitis and tenosynovitis, unspecified: Secondary | ICD-10-CM

## 2017-09-04 MED ORDER — MELOXICAM 15 MG PO TABS: 15.0000 mg | ORAL_TABLET | Freq: Every day | ORAL | 3 refills | Status: DC

## 2017-09-06 NOTE — Progress Notes (Signed)
   Subjective:  36 year old female presenting today for follow-up evaluation of bilateral foot pain.  She states she continues to experience pain in the left arch. She also has a new complaint of pain to the anterior left ankle and the dorsum of the left foot. She reports associated pain to the first MPJ joint of the left foot.  These new symptoms began approximately 4 days ago.  She has not done anything to treat the pain.  Walking increases the symptoms.  There are no alleviating factors noted. Patient presents for further treatment and evaluation.   Past Medical History:  Diagnosis Date  . Myasthenia gravis (HCC)      Objective / Physical Exam:  General:  The patient is alert and oriented x3 in no acute distress. Dermatology:  Skin is warm, dry and supple bilateral lower extremities. Negative for open lesions or macerations. Vascular:  Palpable pedal pulses bilaterally. No edema or erythema noted. Capillary refill within normal limits. Neurological:  Epicritic and protective threshold grossly intact bilaterally.  Musculoskeletal Exam:  Pain on palpation to the anterior lateral medial aspects of the patient's left ankle. Mild edema noted.  Pain with palpation to the first MPJ of the left foot.  Range of motion within normal limits to all pedal and ankle joints bilateral. Muscle strength 5/5 in all groups bilateral.   Radiographic Exam:  Normal osseous mineralization. Joint spaces preserved. No fracture/dislocation/boney destruction.    Assessment: #1 pain in left ankle #2 synovitis of left ankle #3  First MPJ capsulitis left  Plan of Care:  #1 Patient was evaluated.  X-rays were reviewed. #2 injection of 0.5 mL Celestone Soluspan injected in the patient's left ankle. #3  Injection of 0.5 mL Celestone Soluspan injected into the patient's first MPJ of the left foot. #4  Prescription for meloxicam 15 mg provided for patient. #5 patient is to return to clinic as needed.  Patient  works at [employer].   Felecia ShellingBrent M. Jahmar Mckelvy, DPM [facility]  Dr. Felecia ShellingBrent M. Julissa Browning, DPM    [address]                                        [place], [place] [address]                Office [phone]  Fax [phone]

## 2017-10-03 DIAGNOSIS — Z3042 Encounter for surveillance of injectable contraceptive: Secondary | ICD-10-CM | POA: Diagnosis not present

## 2017-10-03 DIAGNOSIS — Z01419 Encounter for gynecological examination (general) (routine) without abnormal findings: Secondary | ICD-10-CM | POA: Diagnosis not present

## 2017-10-03 DIAGNOSIS — R875 Abnormal microbiological findings in specimens from female genital organs: Secondary | ICD-10-CM | POA: Diagnosis not present

## 2017-10-03 DIAGNOSIS — Z3009 Encounter for other general counseling and advice on contraception: Secondary | ICD-10-CM | POA: Diagnosis not present

## 2017-10-03 DIAGNOSIS — Z7189 Other specified counseling: Secondary | ICD-10-CM | POA: Diagnosis not present

## 2017-10-03 DIAGNOSIS — Z30013 Encounter for initial prescription of injectable contraceptive: Secondary | ICD-10-CM | POA: Diagnosis not present

## 2017-10-03 LAB — HM PAP SMEAR: HM PAP: NEGATIVE

## 2017-10-04 LAB — HM HIV SCREENING LAB: HM HIV Screening: NEGATIVE

## 2017-10-05 DIAGNOSIS — E538 Deficiency of other specified B group vitamins: Secondary | ICD-10-CM | POA: Diagnosis not present

## 2017-10-05 DIAGNOSIS — G7000 Myasthenia gravis without (acute) exacerbation: Secondary | ICD-10-CM | POA: Diagnosis not present

## 2017-12-14 ENCOUNTER — Ambulatory Visit (INDEPENDENT_AMBULATORY_CARE_PROVIDER_SITE_OTHER): Payer: Medicare Other | Admitting: Family Medicine

## 2017-12-14 ENCOUNTER — Encounter: Payer: Self-pay | Admitting: Family Medicine

## 2017-12-14 VITALS — BP 132/86 | HR 77 | Temp 98.9°F | Resp 16 | Ht 64.0 in | Wt 216.8 lb

## 2017-12-14 DIAGNOSIS — L308 Other specified dermatitis: Secondary | ICD-10-CM | POA: Diagnosis not present

## 2017-12-14 DIAGNOSIS — N898 Other specified noninflammatory disorders of vagina: Secondary | ICD-10-CM

## 2017-12-14 DIAGNOSIS — L304 Erythema intertrigo: Secondary | ICD-10-CM | POA: Diagnosis not present

## 2017-12-14 DIAGNOSIS — G7000 Myasthenia gravis without (acute) exacerbation: Secondary | ICD-10-CM | POA: Diagnosis not present

## 2017-12-14 MED ORDER — TRIAMCINOLONE ACETONIDE 0.1 % EX CREA: 1.0000 "application " | TOPICAL_CREAM | Freq: Two times a day (BID) | CUTANEOUS | 0 refills | Status: DC

## 2017-12-14 NOTE — Progress Notes (Signed)
Name: Raven Ray   MRN: [id number]    DOB: [birth year]   Date:12/14/2017       Progress Note  Subjective  Chief Complaint  Chief Complaint  Patient presents with  . Rash    Onset- 4 months, increasly getting worst-right on public area-itching and rash. Needs a refill on Triamcinolone cream    HPI  Myasthenia Gravis She had double vision as a child, [facility] an [facility], had thymectomy at age 89 yo, she took medication in the past but does not like side effects. She states it makes her feel too up beat, cannot take naps. States she takes the medications periodically if necessary. She states she takes naps in the pm's, strabismus is much worse when tired, but states she doesn't let herself get to that point.Patient follows up with Dr Sherryll BurgerShah- neurology. 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   Eczema she is doing well with topical triamcinolone and rash on arm is under control at this time. Patient has a kitten that has been playing rough with her. Patient has excoriation on bilateral arms.   Intertrigo Patient take ketoconazole cream under breasts. Patient sts she cleans well but sts gets really moist.    Vaginal Itching patient states perineal area has increased in itching. Patient states she has been to the health department for full STD check and has been negative. Denies vaginal discharge. Pt did not notice rash but put her triamcinolone cream and has had relief of symptoms. Pt has not been sexually active for 3 months and has been checked twice- first time + for chlamydia but negative second time after treatment.     Patient Active Problem List   Diagnosis Date Noted  . Myasthenia gravis (HCC) 10/10/2016  . Eczema 10/10/2016    Past Surgical History:  Procedure Laterality Date  . CESAREAN SECTION  10/10/1999  . TOTAL THYMECTOMY  1991    Family History  Problem Relation Age of Onset  . Hypertension Mother   . Hypertension Father   .  Diabetes Maternal Grandmother     Social History   Socioeconomic History  . Marital status: Single    Spouse name: Not on file  . Number of children: 2  . Years of education: Not on file  . Highest education level: Not on file  Social Needs  . Financial resource strain: Not on file  . Food insecurity - worry: Not on file  . Food insecurity - inability: Not on file  . Transportation needs - medical: Not on file  . Transportation needs - non-medical: Not on file  Occupational History  . Not on file  Tobacco Use  . Smoking status: Never Smoker  . Smokeless tobacco: Never Used  Substance and Sexual Activity  . Alcohol use: No  . Drug use: No  . Sexual activity: No    Birth control/protection: Injection  Other Topics Concern  . Not on file  Social History Narrative   She is on disability but she states she went back to work part time to not feel disabled     Current Outpatient Medications:  .  Cholecalciferol (VITAMIN D-1000 MAX ST) 1000 units tablet, Take by mouth., Disp: , Rfl:  .  meloxicam (MOBIC) 15 MG tablet, Take 1 tablet (15 mg total) by mouth daily., Disp: 30 tablet, Rfl: 3 .  triamcinolone cream (KENALOG) 0.1 %, Apply 1 application topically 2 (two) times daily. (Patient taking differently: Apply 1  application topically 2 (two) times daily. ), Disp: 453.6 g, Rfl: 0 .  vitamin B-12 (CYANOCOBALAMIN) 1000 MCG tablet, Take by mouth., Disp: , Rfl:  .  pyridostigmine (MESTINON) 60 MG tablet, Take by mouth., Disp: , Rfl:   No Known Allergies  ROS  Constitutional: Negative for fever or weight change.  Respiratory: Negative for cough and shortness of breath.   Cardiovascular: Negative for chest pain or palpitations.  Gastrointestinal: Negative for abdominal pain, no bowel changes.  Musculoskeletal: Negative for gait problem or joint swelling.  Skin: Postive for rash.  Neurological: Negative for dizziness or headache.  No other specific complaints in a complete review  of systems (except as listed in HPI above).   Objective  Vitals:   12/14/17 1204  BP: 132/86  Pulse: 77  Resp: 16  Temp: 98.9 F (37.2 C)  TempSrc: Oral  SpO2: 95%  Weight: 216 lb 12.8 oz (98.3 kg)  Height: 5\' 4"  (1.626 m)    Body mass index is 37.21 kg/m.  Physical Exam  Constitutional: Patient appears well-developed and well-nourished. Obese No distress.  HEENT: head atraumatic, normocephalic, pupils equal and reactive to light,  neck supple, throat within normal limits, strabismus  Cardiovascular: Normal rate, regular rhythm and normal heart sounds.  No murmur heard. No BLE edema. Pulmonary/Chest: Effort normal and breath sounds normal. No respiratory distress. Abdominal: Soft.  There is no tenderness. Psychiatric: Patient has a normal mood and affect. behavior is normal. Judgment and thought content normal. Neurological: no focal findings.  Breast/GYN: no rash noted under breast, small red, firm circular area noted, mildly tender, no drainage- located 12 o clock perineal area.   No results found for this or any previous visit (from the past 72 hour(s)).   PHQ2/9: Depression screen [facility] 2/9 12/14/2017 06/14/2017 06/11/2017 09/06/2016  Decreased Interest 0 0 0 0  Down, Depressed, Hopeless 0 0 0 0  PHQ - 2 Score 0 0 0 0     Fall Risk: Fall Risk  12/14/2017 06/14/2017 06/11/2017 09/06/2016  Falls in the past year? No No No No     Functional Status Survey: Is the patient deaf or have difficulty hearing?: No Does the patient have difficulty seeing, even when wearing glasses/contacts?: No Does the patient have difficulty concentrating, remembering, or making decisions?: No Does the patient have difficulty walking or climbing stairs?: No Does the patient have difficulty dressing or bathing?: No Does the patient have difficulty doing errands alone such as visiting a doctor's office or shopping?: No  Assessment & Plan  1. Myasthenia gravis (HCC) - Continue current  therapies, sees Dr. Sherryll Burger  2. Other eczema - Continue current therapies - triamcinolone cream (KENALOG) 0.1 %; Apply 1 application topically 2 (two) times daily.  Dispense: 453 g; Refill: 0  3. Intertrigo Keep area clean and dry  4. Vaginal itching Avoid itching, monitor area, Use OTC mederma or hydrocortisone as needed.   There are no diagnoses linked to this encounter.  -Reviewed Health Maintenance: She thinks papsmear was done at health department- will contact us.

## 2017-12-14 NOTE — Patient Instructions (Addendum)
You can use mederma cream OTC for your cat scratches.  You can use hydrocortisone cream 1%  Or merderma for your itching.  Pruritus Pruritus is an itching feeling. There are many different conditions and factors that can make your skin itchy. Dry skin is one of the most common causes of itching. Most cases of itching do not require medical attention. Itchy skin can turn into a rash. Follow these instructions at home: Watch your pruritus for any changes. Take these steps to help with your condition: Skin Care  Moisturize your skin as needed. A moisturizer that contains petroleum jelly is best for keeping moisture in your skin.  Take or apply medicines only as directed by your health care provider. This may include: ? Corticosteroid cream. ? Anti-itch lotions. ? Oral anti-histamines.  Apply cool compresses to the affected areas.  Try taking a bath with: ? Epsom salts. Follow the instructions on the packaging. You can get these at your local pharmacy or grocery store. ? Baking soda. Pour a small amount into the bath as directed by your health care provider. ? Colloidal oatmeal. Follow the instructions on the packaging. You can get this at your local pharmacy or grocery store.  Try applying baking soda paste to your skin. Stir water into baking soda until it reaches a paste-like consistency.  Do not scratch your skin.  Avoid hot showers or baths, which can make itching worse. A cold shower may help with itching as long as you use a moisturizer after.  Avoid scented soaps, detergents, and perfumes. Use gentle soaps, detergents, perfumes, and other cosmetic products. General instructions  Avoid wearing tight clothes.  Keep a journal to help track what causes your itch. Write down: ? What you eat. ? What cosmetic products you use. ? What you drink. ? What you wear. This includes jewelry.  Use a humidifier. This keeps the air moist, which helps to prevent dry skin. Contact a health  care provider if:  The itching does not go away after several days.  You sweat at night.  You have weight loss.  You are unusually thirsty.  You urinate more than normal.  You are more tired than normal.  You have abdominal pain.  Your skin tingles.  You feel weak.  Your skin or the whites of your eyes look yellow (jaundice).  Your skin feels numb. This information is not intended to replace advice given to you by your health care provider. Make sure you discuss any questions you have with your health care provider. Document Released: 05/31/2011 Document Revised: 02/24/2016 Document Reviewed: 09/14/2014 Elsevier Interactive Patient Education  [employer]2018 [employer]vier [employer]

## 2018-01-22 DIAGNOSIS — G7000 Myasthenia gravis without (acute) exacerbation: Secondary | ICD-10-CM | POA: Diagnosis not present

## 2018-03-04 ENCOUNTER — Emergency Department
Admission: EM | Admit: 2018-03-04 | Discharge: 2018-03-04 | Disposition: A | Discharge status: Home / Self Care | Payer: Medicare Other | Attending: Emergency Medicine | Admitting: Emergency Medicine

## 2018-03-04 ENCOUNTER — Encounter: Payer: Self-pay | Admitting: *Deleted

## 2018-03-04 ENCOUNTER — Emergency Department: Payer: Medicare Other

## 2018-03-04 ENCOUNTER — Other Ambulatory Visit: Payer: Self-pay

## 2018-03-04 ENCOUNTER — Ambulatory Visit: Payer: Self-pay

## 2018-03-04 DIAGNOSIS — R079 Chest pain, unspecified: Secondary | ICD-10-CM | POA: Diagnosis not present

## 2018-03-04 DIAGNOSIS — K219 Gastro-esophageal reflux disease without esophagitis: Secondary | ICD-10-CM | POA: Diagnosis not present

## 2018-03-04 DIAGNOSIS — R0789 Other chest pain: Secondary | ICD-10-CM | POA: Diagnosis not present

## 2018-03-04 DIAGNOSIS — Z79899 Other long term (current) drug therapy: Secondary | ICD-10-CM | POA: Diagnosis not present

## 2018-03-04 LAB — CBC
HCT: 37.4 % (ref 35.0–47.0)
Hemoglobin: 12.8 g/dL (ref 12.0–16.0)
MCH: 30 pg (ref 26.0–34.0)
MCHC: 34.1 g/dL (ref 32.0–36.0)
MCV: 87.9 fL (ref 80.0–100.0)
Platelets: 215 10*3/uL (ref 150–440)
RBC: 4.26 MIL/uL (ref 3.80–5.20)
RDW: 13.4 % (ref 11.5–14.5)
WBC: 6.3 10*3/uL (ref 3.6–11.0)

## 2018-03-04 LAB — BASIC METABOLIC PANEL
Anion gap: 8 (ref 5–15)
BUN: 15 mg/dL (ref 6–20)
CHLORIDE: 105 mmol/L (ref 101–111)
CO2: 23 mmol/L (ref 22–32)
Calcium: 8.5 mg/dL — ABNORMAL LOW (ref 8.9–10.3)
Creatinine, Ser: 0.57 mg/dL (ref 0.44–1.00)
GFR calc Af Amer: >60 mL/min (ref >60)
GFR calc non Af Amer: >60 mL/min (ref >60)
GLUCOSE: 120 mg/dL — AB (ref 65–99)
POTASSIUM: 3.6 mmol/L (ref 3.5–5.1)
Sodium: 136 mmol/L (ref 135–145)

## 2018-03-04 LAB — HEPATIC FUNCTION PANEL
ALBUMIN: 3.7 g/dL (ref 3.5–5.0)
ALK PHOS: 49 U/L (ref 38–126)
ALT: 14 U/L (ref 14–54)
AST: 21 U/L (ref 15–41)
Bilirubin, Direct: <0.1 mg/dL — ABNORMAL LOW (ref 0.1–0.5)
TOTAL PROTEIN: 7.4 g/dL (ref 6.5–8.1)
Total Bilirubin: 0.5 mg/dL (ref 0.3–1.2)

## 2018-03-04 LAB — TROPONIN I: Troponin I: <0.03 ng/mL (ref <0.03)

## 2018-03-04 LAB — LIPASE, BLOOD: Lipase: 28 U/L (ref 11–51)

## 2018-03-04 LAB — POCT PREGNANCY, URINE: Preg Test, Ur: NEGATIVE

## 2018-03-04 MED ORDER — OMEPRAZOLE MAGNESIUM 20 MG PO TBEC: 20.0000 mg | DELAYED_RELEASE_TABLET | Freq: Every day | ORAL | 1 refills | Status: DC

## 2018-03-04 MED ORDER — GI COCKTAIL ~~LOC~~
30.0000 mL | Freq: Once | ORAL | Status: AC
  Administered 2018-03-04: 30 mL via ORAL
  Filled 2018-03-04: qty 30

## 2018-03-04 NOTE — Discharge Instructions (Signed)
We believe your symptoms are a result of GERD (acid reflux).  Please read through the included information and follow up with your regular doctor.  In the meantime, we encourage you to try an over-the-counter medication such as Prilosec OTC.  Give it at least a week at see if your symptoms improve. ° °Return to the Emergency Department with new or worsening symptoms that concern you. °

## 2018-03-04 NOTE — ED Triage Notes (Signed)
Pt ambulatory to triage.  Pt has chest pain in center of chest   No sob.  No n/v/d.  Pt alert.  Speech clear.

## 2018-03-04 NOTE — ED Triage Notes (Signed)
First Nurse Note:  C/O intermittent chest pain since Saturday.  States pain worsens with deep breathing and speaking.   No SOB/ DOE.  Skin warm and dry.

## 2018-03-04 NOTE — ED Provider Notes (Signed)
[facility] Emergency Department Provider Note  ____________________________________________   First MD Initiated Contact with Patient 03/04/18 1816     (approximate)  I have reviewed the triage vital signs and the nursing notes.   HISTORY  Chief Complaint Chest Pain    HPI Raven Ray is a 37 y.o. female with a history of myasthenia gravis who presents for evaluation of several days of discomfort in her chest and up into her throat.  She reports that it feels like a burning sensation but she does not have a history of acid reflux of which she is aware.  She reports that eating does seem to make a little bit worse but "that doesn't stop me".  She has had no nausea or vomiting but she says that she has been burping a little bit more than usual and that it has a foul taste.  She denies fever/chills, nausea, vomiting, abdominal pain, and dysuria.  She has no history of heart disease.  She has no history of blood clots in the legs or lungs.  She does not take exogenous estrogen, has had no recent surgeries or immobilizations, and has had no leg pain or swelling.  She reports that the symptoms are mild to moderate but frustrating since they have been present for about 3 days.  Past Medical History:  Diagnosis Date  . Myasthenia gravis [facility])     Patient Active Problem List   Diagnosis Date Noted  . Myasthenia gravis (HCC) 10/10/2016  . Eczema 10/10/2016    Past Surgical History:  Procedure Laterality Date  . CESAREAN SECTION  10/10/1999  . TOTAL THYMECTOMY  1991    Prior to Admission medications   Medication Sig Start Date End Date Taking? Authorizing Provider  Cholecalciferol (VITAMIN D-1000 MAX ST) 1000 units tablet Take by mouth.    [provider]  meloxicam (MOBIC) 15 MG tablet Take 1 tablet (15 mg total) by mouth daily. 09/04/17   Felecia Shelling, DPM  omeprazole (PRILOSEC OTC) 20 MG tablet Take 1 tablet (20 mg total) by mouth daily.  03/04/18 03/04/19  Loleta Rose, MD  pyridostigmine (MESTINON) 60 MG tablet Take by mouth. 08/07/17 12/05/17  [provider]  triamcinolone cream (KENALOG) 0.1 % Apply 1 application topically 2 (two) times daily. 12/14/17   Poulose, Percell Belt, NP  vitamin B-12 (CYANOCOBALAMIN) 1000 MCG tablet Take by mouth.    [provider]    Allergies Patient has no known allergies.  Family History  Problem Relation Age of Onset  . Hypertension Mother   . Hypertension Father   . Diabetes Maternal Grandmother     Social History Social History   Tobacco Use  . Smoking status: Never Smoker  . Smokeless tobacco: Never Used  Substance Use Topics  . Alcohol use: No  . Drug use: No    Review of Systems Constitutional: No fever/chills Eyes: No visual changes. ENT: No sore throat. Cardiovascular: Chest or upper abdomen burning discomfort as described above Respiratory: Denies shortness of breath. Gastrointestinal: Chest or upper abdomen burning discomfort as described above.  No nausea, no vomiting.  No diarrhea.  No constipation. Genitourinary: Negative for dysuria. Musculoskeletal: Negative for neck pain.  Negative for back pain. Integumentary: Negative for rash. Neurological: Negative for headaches, focal weakness or numbness.   ____________________________________________   PHYSICAL EXAM:  VITAL SIGNS: ED Triage Vitals  Enc Vitals Group     BP 03/04/18 1646 137/81     Pulse Rate 03/04/18 1646  76     Resp 03/04/18 1646 20     Temp 03/04/18 1646 98.6 F (37 C)     Temp Source 03/04/18 1646 Oral     SpO2 03/04/18 1646 99 %     Weight 03/04/18 1647 95.3 kg (210 lb)     Height 03/04/18 1647 1.549 m (5\' 1" )     Head Circumference --      Peak Flow --      Pain Score 03/04/18 1647 10     Pain Loc --      Pain Edu? --      Excl. in GC? --     Constitutional: Alert and oriented. Well appearing and in no acute distress. Eyes: Conjunctivae are normal.  Head:  Atraumatic. Nose: No congestion/rhinnorhea. Mouth/Throat: Mucous membranes are moist. Neck: No stridor.  No meningeal signs.   Cardiovascular: Normal rate, regular rhythm. Good peripheral circulation. Grossly normal heart sounds. Respiratory: Normal respiratory effort.  No retractions. Lungs CTAB.  Some reproducible central to right-sided chest wall tenderness but more discomfort is reproduced with range of motion of the right arm and shoulder. Gastrointestinal: Soft and nontender. No distention.  Negative Murphy sign, no tenderness at McBurney's point. Musculoskeletal: No lower extremity tenderness nor edema. No gross deformities of extremities. Neurologic:  Normal speech and language. No gross focal neurologic deficits are appreciated.  Skin:  Skin is warm, dry and intact. No rash noted. Psychiatric: Mood and affect are normal. Speech and behavior are normal.  ____________________________________________   LABS (all labs ordered are listed, but only abnormal results are displayed)  Labs Reviewed  BASIC METABOLIC PANEL - Abnormal; Notable for the following components:      Result Value   Glucose, Bld 120 (*)    Calcium 8.5 (*)    All other components within normal limits  HEPATIC FUNCTION PANEL - Abnormal; Notable for the following components:   Bilirubin, Direct <0.1 (*)    All other components within normal limits  CBC  TROPONIN I  LIPASE, BLOOD  POC URINE PREG, ED  POCT PREGNANCY, URINE   ____________________________________________  EKG  ED ECG REPORT I, Loleta Rose, the attending physician, personally viewed and interpreted this ECG.  Date: 03/04/2018 EKG Time: 16: 41 Rate: 64 Rhythm: normal sinus rhythm QRS Axis: normal Intervals: normal, borderline criteria for LVH ST/T Wave abnormalities: normal Narrative Interpretation: no evidence of acute ischemia  ____________________________________________  RADIOLOGY   ED MD interpretation: No indication of acute  cardiopulmonary disease.  Official radiology report(s): Dg Chest 2 View  Result Date: 03/04/2018 CLINICAL DATA:  Intermittent chest pain EXAM: CHEST - 2 VIEW COMPARISON:  Chest CT 08/20/2017 FINDINGS: Heart and mediastinal contours are within normal limits. No focal opacities or effusions. No acute bony abnormality. S-shaped thoracolumbar scoliosis. IMPRESSION: No active cardiopulmonary disease. Electronically Signed   By: Charlett Nose M.D.   On: 03/04/2018 17:47    ____________________________________________   PROCEDURES  Critical Care performed: No   Procedure(s) performed:   Procedures   ____________________________________________   INITIAL IMPRESSION / ASSESSMENT AND PLAN / ED COURSE  As part of my medical decision making, I reviewed the following data within the electronic MEDICAL RECORD NUMBER Nursing notes reviewed and incorporated, Labs reviewed , EKG interpreted  and Notes from prior ED visits    Differential diagnosis includes, but is not limited to, acid reflux, gallbladder disease, ACS, PE, musculoskeletal strain.  The patient does have a degree of reproducible tenderness and pain consistent with musculoskeletal strain,  but she is describing a burning pain that moves up and down in the center part of her chest and seems to be worse after eating.  This sounds most consistent with acid reflux.  She is obese and reports that she has been gaining weight over the last year, as much as 50 pounds, and she has just been noticing her symptoms recently but they have been consistent over the last 3 days.  Gallbladder disease is possible but she has [place] no tenderness to palpation of the epigastrium or right upper quadrant with a negative Murphy sign and her LFTs and lipase are normal.  All of her lab work is within normal limits.  She is low risk for ACS and PERC negative.  I gave her a GI cocktail and she felt a little bit better although the symptoms did not completely resolved.  I  encouraged her to take Prilosec and follow-up with her primary care doctor.  I provided some additional information about acid reflux.  There is no indication for second troponin given the duration of the symptoms and the low risk for ACS based on HEART score.  She understands and agrees with the plan.    I gave my usual and customary return precautions.       ____________________________________________  FINAL CLINICAL IMPRESSION(S) / ED DIAGNOSES  Final diagnoses:  Atypical chest pain  Gastroesophageal reflux disease, esophagitis presence not specified     MEDICATIONS GIVEN DURING THIS VISIT:  Medications  gi cocktail (Maalox,Lidocaine,Donnatal) (30 mLs Oral Given 03/04/18 1832)     ED Discharge Orders        Ordered    omeprazole (PRILOSEC OTC) 20 MG tablet  Daily     03/04/18 1924       Note:  This document was prepared using Dragon voice recognition software and may include unintentional dictation errors.    Loleta RoseForbach, Rashaud Ybarbo, MD 03/04/18 1929

## 2018-03-04 NOTE — Telephone Encounter (Signed)
Phone call from pt. with c/o mid to right anterior chest pain.  Stated it started on Saturday about 11:00-12:00 PM, and has remained constant.  Reported there is radiation of the chest pain to upper chest and throat area.  Stated she gets short of breath at intervals.  Reported pain level, at present, is "10/10."  Stated she feels the pain increase with deep breath, or if needs to breathe harder, or if she coughs.  Denied dizziness, sweating, nausea, or vomiting.  Reported she felt on Saturday, it may have been caused by heart burn,   Reported eating a chicken biscuit and pizza with crushed red pepper, on Saturday.  Stated, "if it were heartburn, it should have eased up by now."  Reported "if I move or sit down hard, the pain is worse."  Advised to go to the ER due to the severity of the pain, and the increase in pain with taking deep breath, and c/o shortness of breath.  Pt. verb. understanding.  Advised to have someone else drive her to the ER.  Agrees with plan.         Reason for Disposition . Chest pain lasts > 5 minutes (Exceptions: chest pain occurring > 3 days ago and now asymptomatic; same as previously diagnosed heartburn and has accompanying sour taste in mouth)  Answer Assessment - Initial Assessment Questions 1. LOCATION: "Where does it hurt?"       Somewhat in middle and toward the right breast  2. RADIATION: "Does the pain go anywhere else?" (e.g., into neck, jaw, arms, back)    Radiates to upper chest / throat area; if pulls arms out to reach, it makes the pain worse  3. ONSET: "When did the chest pain begin?" (Minutes, hours or days)      Saturday morning about 11:00-12:00 PM 4. PATTERN "Does the pain come and go, or has it been constant since it started?"  "Does it get worse with exertion?"      Constant; reminds me of being punched in chest; burning. ; ate a chicken biscuit that morning and pizza with crushed red pepper.  5. DURATION: "How long does it last" (e.g., seconds,  minutes, hours)    Constant ; varies in intensity 6. SEVERITY: "How bad is the pain?"  (e.g., Scale 1-10; mild, moderate, or severe)    - MILD (1-3): doesn't interfere with normal activities     - MODERATE (4-7): interferes with normal activities or awakens from sleep    - SEVERE (8-10): excruciating pain, unable to do any normal activities      10/10 7. CARDIAC RISK FACTORS: "Do you have any history of heart problems or risk factors for heart disease?" (e.g., prior heart attack, angina; high blood pressure, diabetes, being overweight, high cholesterol, smoking, or strong family history of heart disease) No cardiac hx     8. PULMONARY RISK FACTORS: "Do you have any history of lung disease?"  (e.g., blood clots in lung, asthma, emphysema, birth control pills)     Denied hx PE, asthma, or COPD 9. CAUSE: "What do you think is causing the chest pain?"     unknown 10. OTHER SYMPTOMS: "Do you have any other symptoms?" (e.g., dizziness, nausea, vomiting, sweating, fever, difficulty breathing, cough)       Denies dizziness, nausea or vomiting, sweating, fever or chills; c/o intermittent SOB and it hurts when she coughs; denies active cough 11. PREGNANCY: "Is there any chance you are pregnant?" "When was your last menstrual period?"  No ; LMP aboutg 1 mo. Ago  Protocols used: CHEST PAIN-A-AH

## 2018-03-04 NOTE — ED Notes (Signed)
poct pregnancy Negative 

## 2018-03-05 NOTE — Telephone Encounter (Signed)
Please make sure patient is doing better, and followed up as recommended.

## 2018-03-06 NOTE — Telephone Encounter (Signed)
Spoke with patient and she states she did go to the ER and they performed a bunch of tests. They did blood work, [employer] and states she just has heartburn and given Prilosec. But patient has not seen any improvement and just wants to be seen here.

## 2018-03-06 NOTE — Telephone Encounter (Signed)
Did you get her an appointment ?

## 2018-03-07 NOTE — Telephone Encounter (Signed)
This is my first time seeing that she needed and appointment. I routed this to you and instead of you responding back you routed it back to Theodoreiffany. Please look at the routing history so the correct person can respond to the messages. Tiffany and I do not share the same basket so if I send you something please send it back to me so we want have to guess what is going on with the patient. Underneath the patient's picture I see that she already has an appointment with Lanora ManisElizabeth on 03/08/2018 @ 9:40 would you like her to schedule an appointment with you.  I will send this to the front desk to schedule her an appointment.

## 2018-03-08 ENCOUNTER — Encounter: Payer: Self-pay | Admitting: Nurse Practitioner

## 2018-03-08 ENCOUNTER — Ambulatory Visit (INDEPENDENT_AMBULATORY_CARE_PROVIDER_SITE_OTHER): Payer: Medicare Other | Admitting: Nurse Practitioner

## 2018-03-08 VITALS — BP 118/80 | HR 94 | Resp 16 | Ht 64.0 in | Wt 216.2 lb

## 2018-03-08 DIAGNOSIS — K219 Gastro-esophageal reflux disease without esophagitis: Secondary | ICD-10-CM

## 2018-03-08 DIAGNOSIS — R079 Chest pain, unspecified: Secondary | ICD-10-CM

## 2018-03-08 DIAGNOSIS — L304 Erythema intertrigo: Secondary | ICD-10-CM | POA: Diagnosis not present

## 2018-03-08 HISTORY — DX: Gastro-esophageal reflux disease without esophagitis: K21.9

## 2018-03-08 MED ORDER — CLOTRIMAZOLE-BETAMETHASONE 1-0.05 % EX CREA: 1.0000 "application " | TOPICAL_CREAM | Freq: Two times a day (BID) | CUTANEOUS | 0 refills | Status: DC

## 2018-03-08 MED ORDER — TRIAMCINOLONE ACETONIDE 0.1 % EX CREA: 1.0000 "application " | TOPICAL_CREAM | Freq: Two times a day (BID) | CUTANEOUS | 0 refills | Status: DC

## 2018-03-08 NOTE — Progress Notes (Addendum)
Name: Raven Ray   MRN: [id number]    DOB: 06-28-1981   Date:03/08/2018       Progress Note  Subjective  Chief Complaint  Chief Complaint  Patient presents with  . ER Follow up  . Chest Pain  . Gastroesophageal Reflux    HPI  Patient was seen in ED on 6/3 for chest pain, EKG- NSR, negative chest x-ray negative trop, cbc and bmp, lipase and hepatic panel. Chest pain was central burning worse after eating, improved without complete resolution with GI cocktail. Given rx for prilosec. States had eaten a lot of chili peppers and did a lot of cleaning that day. sts pain was worse with some movements. sts pain is still present but pain has gone down significantly. Patient is taking prilosec every day and tums with relief of symptoms.   Patient Active Problem List   Diagnosis Date Noted  . Myasthenia gravis (HCC) 10/10/2016  . Eczema 10/10/2016    Past Medical History:  Diagnosis Date  . Myasthenia gravis [facility])     Past Surgical History:  Procedure Laterality Date  . CESAREAN SECTION  10/10/1999  . TOTAL THYMECTOMY  1991    Social History   Tobacco Use  . Smoking status: Never Smoker  . Smokeless tobacco: Never Used  Substance Use Topics  . Alcohol use: No     Current Outpatient Medications:  .  omeprazole (PRILOSEC OTC) 20 MG tablet, Take 1 tablet (20 mg total) by mouth daily., Disp: 28 tablet, Rfl: 1 .  omeprazole (PRILOSEC) 20 MG capsule, Take by mouth daily., Disp: , Rfl: 1 .  triamcinolone cream (KENALOG) 0.1 %, Apply 1 application topically 2 (two) times daily., Disp: 453 g, Rfl: 0 .  vitamin B-12 (CYANOCOBALAMIN) 1000 MCG tablet, Take by mouth., Disp: , Rfl:  .  Cholecalciferol (VITAMIN D-1000 MAX ST) 1000 units tablet, Take by mouth., Disp: , Rfl:  .  meloxicam (MOBIC) 15 MG tablet, Take 1 tablet (15 mg total) by mouth daily. (Patient not taking: Reported on 03/08/2018), Disp: 30 tablet, Rfl: 3 .  pyridostigmine (MESTINON) 60 MG tablet, Take by mouth., Disp: , Rfl:    No Known Allergies  ROS  Constitutional: Negative for fever or weight change.  Respiratory: Negative for cough and shortness of breath.   Cardiovascular: Positive for chest pain Negative  palpitations.  Gastrointestinal: Negative for abdominal pain, no bowel changes.  Musculoskeletal: Negative for gait problem or joint swelling.  Skin: Positive for rash under bilateral breast and under panus.  Neurological: Negative for dizziness or headache.  No other specific complaints in a complete review of systems (except as listed in HPI above).  Objective  Vitals:   03/08/18 1013  BP: 118/80  Pulse: 94  Resp: 16  SpO2: 97%  Weight: 216 lb 3.2 oz (98.1 kg)  Height: 5\' 4"  (1.626 m)     Body mass index is 37.11 kg/m.  Nursing Note and Vital Signs reviewed.  Physical Exam Constitutional: Patient appears well-developed and well-nourished. Obese  No distress.  Cardiovascular: Normal rate, regular rhythm, S1/S2 present.  No murmur or rub heard. Pulses intact, non-tender chest wall Pulmonary/Chest: Effort normal and breath sounds clear. No respiratory distress or retractions. Abdominal: Soft and non-tender, bowel sounds present  Skin: darkened pigmented rash and excoriation marks under bilateral breast and under panus.  Psychiatric: Patient has a normal mood and affect. behavior is normal. Judgment and thought content normal.  No results found for this or any previous visit (from  the past 72 hour(s)).  Assessment & Plan  1. Chest pain, unspecified type Cont ppi, discussed food journal to identify triggers  - H. pylori breath test  2. Gastroesophageal reflux disease, esophagitis presence not specified -cont ppi  - H. pylori breath test  3. Intertrigo Keep area clean and dry - clotrimazole-betamethasone (LOTRISONE) cream; Apply 1 application topically 2 (two) times daily.  Dispense: 30 g; Refill: 0   -Red flags and when to present for emergency care or RTC including fever  >101.6F, chest pain, shortness of breath, new/worsening/un-resolving symptoms,  reviewed with patient at time of visit. Follow up and care instructions discussed and provided in AVS. ---------------------------------------- I have reviewed this encounter including the documentation in this note and/or discussed this patient with the provider, Sharyon CableElizabeth Jakiah Goree DNP AGNP-C. I am certifying that I agree with the content of this note as supervising physician. Baruch GoutyMelinda Lada, MD [facility] 03/08/2018, 5:59 PM

## 2018-03-08 NOTE — Patient Instructions (Addendum)
- Use lotrisone cream under breast and abdomen and follow these instructions:   Keep the affected area clean and dry.  Do not scratch your skin.  Stay cool as much as possible. Use an air conditioner or fan, if you can.  Apply over-the-counter and prescription medicines only as told by your doctor.  If you were prescribed an antibiotic medicine, use it as told by your doctor. Do not stop using the antibiotic even if your condition starts to get better.  Keep all follow-up visits as told by your doctor. This is important. How is this prevented?  Stay at a healthy weight.  Keep your feet dry. This is very important if you have diabetes. Wear cotton or wool socks.  Take care of and protect the skin in your groin and butt area as told by your doctor.  Do not wear tight clothes. Wear clothes that: ? Are loose. ? Take away moisture from your body. ? Are made of cotton.  Wear a bra that gives good support, if needed.  Shower and dry yourself fully after being active.  Keep your blood sugar under control if you have diabetes.   Food Choices for Gastroesophageal Reflux Disease, Adult When you have gastroesophageal reflux disease (GERD), the foods you eat and your eating habits are very important. Choosing the right foods can help ease your discomfort. What guidelines do I need to follow?  Choose fruits, vegetables, whole grains, and low-fat dairy products.  Choose low-fat meat, fish, and poultry.  Limit fats such as oils, salad dressings, butter, nuts, and avocado.  Keep a food diary. This helps you identify foods that cause symptoms.  Avoid foods that cause symptoms. These may be different for everyone.  Eat small meals often instead of 3 large meals a day.  Eat your meals slowly, in a place where you are relaxed.  Limit fried foods.  Cook foods using methods other than frying.  Avoid drinking alcohol.  Avoid drinking large amounts of liquids with your  meals.  Avoid bending over or lying down until 2-3 hours after eating. What foods are not recommended? These are some foods and drinks that may make your symptoms worse: Vegetables Tomatoes. Tomato juice. Tomato and spaghetti sauce. Chili peppers. Onion and garlic. Horseradish. Fruits Oranges, grapefruit, and lemon (fruit and juice). Meats High-fat meats, fish, and poultry. This includes hot dogs, ribs, ham, sausage, salami, and bacon. Dairy Whole milk and chocolate milk. Sour cream. Cream. Butter. Ice cream. Cream cheese. Drinks Coffee and tea. Bubbly (carbonated) drinks or energy drinks. Condiments Hot sauce. Barbecue sauce. Sweets/Desserts Chocolate and cocoa. Donuts. Peppermint and spearmint. Fats and Oils High-fat foods. This includes [place] fries and potato chips. Other Vinegar. Strong spices. This includes black pepper, white pepper, red pepper, cayenne, curry powder, cloves, ginger, and chili powder. The items listed above may not be a complete list of foods and drinks to avoid. Contact your dietitian for more information. This information is not intended to replace advice given to you by your health care provider. Make sure you discuss any questions you have with your health care provider. Document Released: 03/19/2012 Document Revised: 02/24/2016 Document Reviewed: 07/23/2013 Elsevier Interactive Patient Education  2017 [employer]   Calorie Counting for [employer] Calories are units of energy. Your body needs a certain amount of calories from food to keep you going throughout the day. When you eat more calories than your body needs, your body stores the extra calories as fat. When you eat fewer  calories than your body needs, your body burns fat to get the energy it needs. Calorie counting means keeping track of how many calories you eat and drink each day. Calorie counting can be helpful if you need to lose weight. If you make sure to eat fewer calories than your body  needs, you should lose weight. Ask your health care provider what a healthy weight is for you. For calorie counting to work, you will need to eat the right number of calories in a day in order to lose a healthy amount of weight per week. A dietitian can help you determine how many calories you need in a day and will give you suggestions on how to reach your calorie goal.  A healthy amount of weight to lose per week is usually 1-2 lb (0.5-0.9 kg). This usually means that your daily calorie intake should be reduced by 500-750 calories.  Eating 1,200 - 1,500 calories per day can help most women lose weight.  Eating 1,500 - 1,800 calories per day can help most men lose weight.  What is my plan? My goal is to have __________ calories per day. If I have this many calories per day, I should lose around __________ pounds per week. What do I need to know about calorie counting? In order to meet your daily calorie goal, you will need to:  Find out how many calories are in each food you would like to eat. Try to do this before you eat.  Decide how much of the food you plan to eat.  Write down what you ate and how many calories it had. Doing this is called keeping a food log.  To successfully lose weight, it is important to balance calorie counting with a healthy lifestyle that includes regular activity. Aim for 150 minutes of moderate exercise (such as walking) or 75 minutes of vigorous exercise (such as running) each week. Where do I find calorie information?  The number of calories in a food can be found on a Nutrition Facts label. If a food does not have a Nutrition Facts label, try to look up the calories online or ask your dietitian for help. Remember that calories are listed per serving. If you choose to have more than one serving of a food, you will have to multiply the calories per serving by the amount of servings you plan to eat. For example, the label on a package of bread might say that a  serving size is 1 slice and that there are 90 calories in a serving. If you eat 1 slice, you will have eaten 90 calories. If you eat 2 slices, you will have eaten 180 calories. How do I keep a food log? Immediately after each meal, record the following information in your food log:  What you ate. Don't forget to include toppings, sauces, and other extras on the food.  How much you ate. This can be measured in cups, ounces, or number of items.  How many calories each food and drink had.  The total number of calories in the meal.  Keep your food log near you, such as in a small notebook in your pocket, or use a mobile app or website. Some programs will calculate calories for you and show you how many calories you have left for the day to meet your goal. What are some calorie counting tips?  Use your calories on foods and drinks that will fill you up and not leave you hungry: ?  Some examples of foods that fill you up are nuts and nut butters, vegetables, lean proteins, and high-fiber foods like whole grains. High-fiber foods are foods with more than 5 g fiber per serving. ? Drinks such as sodas, specialty coffee drinks, alcohol, and juices have a lot of calories, yet do not fill you up.  Eat nutritious foods and avoid empty calories. Empty calories are calories you get from foods or beverages that do not have many vitamins or protein, such as candy, sweets, and soda. It is better to have a nutritious high-calorie food (such as an avocado) than a food with few nutrients (such as a bag of chips).  Know how many calories are in the foods you eat most often. This will help you calculate calorie counts faster.  Pay attention to calories in drinks. Low-calorie drinks include water and unsweetened drinks.  Pay attention to nutrition labels for "low fat" or "fat free" foods. These foods sometimes have the same amount of calories or more calories than the full fat versions. They also often have added  sugar, starch, or salt, to make up for flavor that was removed with the fat.  Find a way of tracking calories that works for you. Get creative. Try different apps or programs if writing down calories does not work for you. What are some portion control tips?  Know how many calories are in a serving. This will help you know how many servings of a certain food you can have.  Use a measuring cup to measure serving sizes. You could also try weighing out portions on a kitchen scale. With time, you will be able to estimate serving sizes for some foods.  Take some time to put servings of different foods on your favorite plates, bowls, and cups so you know what a serving looks like.  Try not to eat straight from a bag or box. Doing this can lead to overeating. Put the amount you would like to eat in a cup or on a plate to make sure you are eating the right portion.  Use smaller plates, glasses, and bowls to prevent overeating.  Try not to multitask (for example, watch TV or use your computer) while eating. If it is time to eat, sit down at a table and enjoy your food. This will help you to know when you are full. It will also help you to be aware of what you are eating and how much you are eating. What are tips for following this plan? Reading food labels  Check the calorie count compared to the serving size. The serving size may be smaller than what you are used to eating.  Check the source of the calories. Make sure the food you are eating is high in vitamins and protein and low in saturated and trans fats. Shopping  Read nutrition labels while you shop. This will help you make healthy decisions before you decide to purchase your food.  Make a grocery list and stick to it. Cooking  Try to cook your favorite foods in a healthier way. For example, try baking instead of frying.  Use low-fat dairy products. Meal planning  Use more fruits and vegetables. Half of your plate should be fruits and  vegetables.  Include lean proteins like poultry and fish. How do I count calories when eating out?  Ask for smaller portion sizes.  Consider sharing an entree and sides instead of getting your own entree.  If you get your own entree, eat  only half. Ask for a box at the beginning of your meal and put the rest of your entree in it so you are not tempted to eat it.  If calories are listed on the menu, choose the lower calorie options.  Choose dishes that include vegetables, fruits, whole grains, low-fat dairy products, and lean protein.  Choose items that are boiled, broiled, grilled, or steamed. Stay away from items that are buttered, battered, fried, or served with cream sauce. Items labeled "crispy" are usually fried, unless stated otherwise.  Choose water, low-fat milk, unsweetened iced tea, or other drinks without added sugar. If you want an alcoholic beverage, choose a lower calorie option such as a glass of wine or light beer.  Ask for dressings, sauces, and syrups on the side. These are usually high in calories, so you should limit the amount you eat.  If you want a salad, choose a garden salad and ask for grilled meats. Avoid extra toppings like bacon, cheese, or fried items. Ask for the dressing on the side, or ask for olive oil and vinegar or lemon to use as dressing.  Estimate how many servings of a food you are given. For example, a serving of cooked rice is  cup or about the size of half a baseball. Knowing serving sizes will help you be aware of how much food you are eating at restaurants. The list below tells you how big or small some common portion sizes are based on everyday objects: ? 1 oz-4 stacked dice. ? 3 oz-1 deck of cards. ? 1 tsp-1 die. ? 1 Tbsp- a ping-pong ball. ? 2 Tbsp-1 ping-pong ball. ?  cup- baseball. ? 1 cup-1 baseball. Summary  Calorie counting means keeping track of how many calories you eat and drink each day. If you eat fewer calories than your  body needs, you should lose weight.  A healthy amount of weight to lose per week is usually 1-2 lb (0.5-0.9 kg). This usually means reducing your daily calorie intake by 500-750 calories.  The number of calories in a food can be found on a Nutrition Facts label. If a food does not have a Nutrition Facts label, try to look up the calories online or ask your dietitian for help.  Use your calories on foods and drinks that will fill you up, and not on foods and drinks that will leave you hungry.  Use smaller plates, glasses, and bowls to prevent overeating. This information is not intended to replace advice given to you by your health care provider. Make sure you discuss any questions you have with your health care provider. Document Released: 09/18/2005 Document Revised: 08/18/2016 Document Reviewed: 08/18/2016 Elsevier Interactive Patient Education  [employer].

## 2018-03-11 ENCOUNTER — Encounter: Payer: Self-pay | Admitting: Family Medicine

## 2018-03-11 LAB — H. PYLORI BREATH TEST: H. pylori Breath Test: NOT DETECTED

## 2018-03-12 ENCOUNTER — Emergency Department
Admission: EM | Admit: 2018-03-12 | Discharge: 2018-03-12 | Disposition: A | Discharge status: Home / Self Care | Payer: No Typology Code available for payment source | Attending: Emergency Medicine | Admitting: Emergency Medicine

## 2018-03-12 ENCOUNTER — Encounter: Payer: Self-pay | Admitting: Family Medicine

## 2018-03-12 ENCOUNTER — Emergency Department: Payer: No Typology Code available for payment source

## 2018-03-12 ENCOUNTER — Other Ambulatory Visit: Payer: Self-pay

## 2018-03-12 ENCOUNTER — Ambulatory Visit: Payer: Medicare Other | Admitting: Nurse Practitioner

## 2018-03-12 DIAGNOSIS — Z79899 Other long term (current) drug therapy: Secondary | ICD-10-CM | POA: Diagnosis not present

## 2018-03-12 DIAGNOSIS — G501 Atypical facial pain: Secondary | ICD-10-CM | POA: Insufficient documentation

## 2018-03-12 DIAGNOSIS — S199XXA Unspecified injury of neck, initial encounter: Secondary | ICD-10-CM | POA: Diagnosis not present

## 2018-03-12 DIAGNOSIS — Y9389 Activity, other specified: Secondary | ICD-10-CM | POA: Insufficient documentation

## 2018-03-12 DIAGNOSIS — S8992XA Unspecified injury of left lower leg, initial encounter: Secondary | ICD-10-CM | POA: Diagnosis not present

## 2018-03-12 DIAGNOSIS — Y998 Other external cause status: Secondary | ICD-10-CM | POA: Insufficient documentation

## 2018-03-12 DIAGNOSIS — Y92410 Unspecified street and highway as the place of occurrence of the external cause: Secondary | ICD-10-CM | POA: Insufficient documentation

## 2018-03-12 DIAGNOSIS — M542 Cervicalgia: Secondary | ICD-10-CM | POA: Insufficient documentation

## 2018-03-12 DIAGNOSIS — M25562 Pain in left knee: Secondary | ICD-10-CM | POA: Diagnosis not present

## 2018-03-12 DIAGNOSIS — R51 Headache: Secondary | ICD-10-CM | POA: Diagnosis not present

## 2018-03-12 DIAGNOSIS — S0993XA Unspecified injury of face, initial encounter: Secondary | ICD-10-CM | POA: Diagnosis not present

## 2018-03-12 MED ORDER — MELOXICAM 15 MG PO TABS: 15.0000 mg | ORAL_TABLET | Freq: Every day | ORAL | 1 refills | Status: AC

## 2018-03-12 MED ORDER — CYCLOBENZAPRINE HCL 10 MG PO TABS: 10.0000 mg | ORAL_TABLET | Freq: Three times a day (TID) | ORAL | 0 refills | Status: AC | PRN
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
## 2018-04-10 ENCOUNTER — Ambulatory Visit: Payer: Medicare Other | Admitting: Nurse Practitioner

## 2018-04-11 ENCOUNTER — Encounter: Payer: Self-pay | Admitting: Family Medicine

## 2018-04-11 ENCOUNTER — Ambulatory Visit: Payer: Self-pay | Admitting: Family Medicine

## 2018-04-11 DIAGNOSIS — M545 Low back pain, unspecified: Secondary | ICD-10-CM

## 2018-04-11 DIAGNOSIS — R519 Headache, unspecified: Secondary | ICD-10-CM

## 2018-04-11 DIAGNOSIS — R202 Paresthesia of skin: Secondary | ICD-10-CM

## 2018-04-11 DIAGNOSIS — M25562 Pain in left knee: Secondary | ICD-10-CM

## 2018-04-11 DIAGNOSIS — G8911 Acute pain due to trauma: Secondary | ICD-10-CM

## 2018-04-11 DIAGNOSIS — R51 Headache: Secondary | ICD-10-CM

## 2018-04-11 MED ORDER — MELOXICAM 15 MG PO TABS: 15.0000 mg | ORAL_TABLET | Freq: Every day | ORAL | 0 refills | Status: DC
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
## 2018-06-18 ENCOUNTER — Ambulatory Visit (INDEPENDENT_AMBULATORY_CARE_PROVIDER_SITE_OTHER): Payer: Medicare Other

## 2018-06-18 ENCOUNTER — Ambulatory Visit: Payer: Medicare Other

## 2018-06-18 VITALS — BP 132/78 | HR 85 | Temp 98.5°F | Ht 64.0 in | Wt 220.7 lb

## 2018-06-18 DIAGNOSIS — Z0000 Encounter for general adult medical examination without abnormal findings: Secondary | ICD-10-CM

## 2018-06-18 NOTE — Progress Notes (Addendum)
Subjective:   Raven Ray is a 37 y.o. female who presents for Medicare Annual (Subsequent) preventive examination.  Review of Systems:  N/A Cardiac Risk Factors include: sedentary lifestyle;obesity (BMI >30kg/m2)     Objective:     Vitals: BP 132/78 (BP Location: Right Arm, Patient Position: Sitting, Cuff Size: Large)   Pulse 85   Temp 98.5 F (36.9 C)   Ht 5\' 4"  (1.626 m)   Wt 220 lb 11.2 oz (100.1 kg)   SpO2 98%   BMI 37.88 kg/m   Body mass index is 37.88 kg/m.  Advanced Directives 06/18/2018 06/14/2017 06/11/2017 09/06/2016  Does Patient Have a Medical Advance Directive? No No No No  Would patient like information on creating a medical advance directive? Yes (MAU/Ambulatory/Procedural Areas - Information given) - - -    Tobacco Social History   Tobacco Use  Smoking Status Never Smoker  Smokeless Tobacco Never Used  Tobacco Comment   smoking cessation materials not required     Counseling given: No Comment: smoking cessation materials not required  Clinical Intake:  Pre-visit preparation completed: Yes  Pain : No/denies pain   BMI - recorded: 37.88 Nutritional Status: BMI > 30  Obese Nutritional Risks: None Diabetes: No  How often do you need to have someone help you when you read instructions, pamphlets, or other written materials from your doctor or pharmacy?: 1 - Never  Interpreter Needed?: No  Information entered by :: AEversole, LPN  Past Medical History:  Diagnosis Date  . Myasthenia gravis [facility])    Past Surgical History:  Procedure Laterality Date  . CESAREAN SECTION  10/10/1999  . TOTAL THYMECTOMY  1991   Family History  Problem Relation Age of Onset  . Hypertension Mother   . Hypertension Father   . Diabetes Maternal Grandmother    Social History   Socioeconomic History  . Marital status: Single    Spouse name: Not on file  . Number of children: 2  . Years of education: some college  . Highest education level: 12th grade    Occupational History  . Occupation: Disabled  Social Needs  . Financial resource strain: Not hard at all  . Food insecurity:    Worry: Never true    Inability: Never true  . Transportation needs:    Medical: No    Non-medical: No  Tobacco Use  . Smoking status: Never Smoker  . Smokeless tobacco: Never Used  . Tobacco comment: smoking cessation materials not required  Substance and Sexual Activity  . Alcohol use: No  . Drug use: No  . Sexual activity: Never    Birth control/protection: Injection  Lifestyle  . Physical activity:    Days per week: 0 days    Minutes per session: 0 min  . Stress: Not at all  Relationships  . Social connections:    Talks on phone: Patient refused    Gets together: Patient refused    Attends religious service: Patient refused    Active member of club or organization: Patient refused    Attends meetings of clubs or organizations: Patient refused    Relationship status: Never married  Other Topics Concern  . Not on file  Social History Narrative   She is on disability but she states she went back to work part time to not feel disabled    Outpatient Encounter Medications as of 06/18/2018  Medication Sig  . Cholecalciferol (VITAMIN D-1000 MAX ST) 1000 units tablet Take by mouth.  Marland Kitchen  clotrimazole-betamethasone (LOTRISONE) cream Apply 1 application topically 2 (two) times daily.  Marland Kitchen NUVARING 0.12-0.015 MG/24HR vaginal ring PLACE ONE RING PER VAGINA EVERY 3 WEEKS AND REMOVE FOR 4TH WEEK AND REPEAT  . pyridostigmine (MESTINON) 60 MG tablet Take by mouth.  . vitamin B-12 (CYANOCOBALAMIN) 1000 MCG tablet Take by mouth.  . meloxicam (MOBIC) 15 MG tablet Take 1 tablet (15 mg total) by mouth daily. (Patient not taking: Reported on 06/18/2018)  . omeprazole (PRILOSEC) 20 MG capsule Take by mouth daily.   No facility-administered encounter medications on file as of 06/18/2018.     Activities of Daily Living In your present state of health, do you have any  difficulty performing the following activities: 06/18/2018 03/08/2018  Hearing? N N  Comment denies hearing aids -  Vision? N Y  Comment wears eyeglasses prescription glasses  Difficulty concentrating or making decisions? N N  Walking or climbing stairs? N N  Dressing or bathing? N N  Doing errands, shopping? N N  Preparing Food and eating ? N -  Comment denies dentures -  Using the Toilet? N -  In the past six months, have you accidently leaked urine? N -  Do you have problems with loss of bowel control? N -  Managing your Medications? N -  Managing your Finances? N -  Housekeeping or managing your Housekeeping? N -  Some recent data might be hidden    Patient Care Team: Alba Cory, MD as PCP - General (Family Medicine) Lonell Face, MD as Consulting Physician (Neurology)    Assessment:   This is a routine wellness examination for Raven Ray.  Exercise Activities and Dietary recommendations Current Exercise Habits: The patient does not participate in regular exercise at present, Exercise limited by: None identified  Goals    . DIET - INCREASE WATER INTAKE     Recommend to drink at least 6-8 8oz glasses of water per day.    . Exercise 3x per week (30 min per time)     Recommend increasing exercise to 3 times a week for at least 30 minutes.        Fall Risk Fall Risk  06/18/2018 03/08/2018 12/14/2017 06/14/2017 06/11/2017  Falls in the past year? No No No No No  Risk for fall due to : Impaired vision - - - -  Risk for fall due to: Comment wears eyeglasses; myasthenia gravis - - - -   FALL RISK PREVENTION PERTAINING TO HOME: Is your home free of loose throw rugs in walkways, pet beds, electrical cords, etc? Yes Is there adequate lighting in your home to reduce risk of falls?  Yes Are there stairs in or around your home WITH handrails? Yes  ASSISTIVE DEVICES UTILIZED TO PREVENT FALLS: Use of a cane, walker or w/c? No Grab bars in the bathroom? No  Shower chair or a place  to sit while bathing? No An elevated toilet seat or a handicapped toilet? No  Timed Get Up and Go Performed: Yes. Pt ambulated 10 feet within 5 sec. Gait stead-fast and without the use of an assistive device. No intervention required at this time. Fall risk prevention has been discussed.  Community Resource Referral:  Pt declined my offer to send [employer] Referral to Care Guide for installation of grab bars in the shower, shower chair or an elevated toilet seat.  Depression Screen PHQ 2/9 Scores 06/18/2018 12/14/2017 06/14/2017 06/11/2017  PHQ - 2 Score 0 0 0 0  PHQ- 9 Score 0 - - -  Cognitive Function     6CIT Screen 06/18/2018  What Year? 0 points  What month? 0 points  What time? 0 points  Count back from 20 0 points  Months in reverse 0 points  Repeat phrase 0 points  Total Score 0    Immunization History  Administered Date(s) Administered  . Influenza,inj,Quad PF,6+ Mos 06/14/2017    Qualifies for Shingles Vaccine? No  Due for Flu vaccine. Declined my offer to administer today. Education has been provided regarding the importance of this vaccine but still declined. Advised may receive this vaccine at local pharmacy or Health Dept. Aware to provide a copy of the vaccination record if obtained from local pharmacy or Health Dept. Verbalized acceptance and understanding.  Screening Tests Health Maintenance  Topic Date Due  . INFLUENZA VACCINE  07/18/2018 (Originally 05/02/2018)  . PAP SMEAR  10/03/2020  . TETANUS/TDAP  06/11/2023  . HIV Screening  Completed    Cancer Screenings: Lung: Low Dose CT Chest recommended if Age 59-80 years, 30 pack-year currently smoking OR have quit w/in 15years. Patient does not qualify. Breast Screening: Not yet required   Bone Density/Dexa: Not yet required Colorectal: Not yet required  Additional Screenings: Hepatitis C Screening: Does not qualify    Plan:  I have personally reviewed and addressed the Medicare Annual  Wellness questionnaire and have noted the following in the patient's chart:  A. Medical and social history B. Use of alcohol, tobacco or illicit drugs  C. Current medications and supplements D. Functional ability and status E.  Nutritional status F.  Physical activity G. Advance directives H. List of other physicians I.  Hospitalizations, surgeries, and ER visits in previous 12 months J.  Vitals K. Screenings such as hearing and vision if needed, cognitive and depression L. Referrals and appointments  In addition, I have reviewed and discussed with patient certain preventive protocols, quality metrics, and best practice recommendations. A written personalized care plan for preventive services as well as general preventive health recommendations were provided to patient.  See attached scanned questionnaire for additional information.   Signed,  Deon PillingAmmie Natasha Burda, LPN Nurse Health Advisor

## 2018-06-18 NOTE — Patient Instructions (Signed)
Raven Ray , Thank you for taking time to come for your Medicare Wellness Visit. I appreciate your ongoing commitment to your health goals. Please review the following plan we discussed and let me know if I can assist you in the future.   Screening recommendations/referrals: Colorectal Screening: Not required Mammogram: Not required Bone Density: Not required  Vision and Dental Exams: Recommended annual ophthalmology exams for early detection of glaucoma and other disorders of the eye Recommended annual dental exams for proper oral hygiene  Vaccinations: Influenza vaccine: Declined Pneumococcal vaccine: Not required Tdap vaccine: Up to date Shingles vaccine: Not required  Advanced directives: Advance directives discussed with you today. I have provided a copy for you to complete at home and have notarized. Once this is complete please bring a copy in to our office so we can scan it into your chart.  Goals: Recommend to drink at least 6-8 8oz glasses of water per day.  Next appointment: Please schedule your Annual Wellness Visit with your Nurse Health Advisor in one year.  Preventive Care 40-64 Years, Female Preventive care refers to lifestyle choices and visits with your health care provider that can promote health and wellness. What does preventive care include?  A yearly physical exam. This is also called an annual well check.  Dental exams once or twice a year.  Routine eye exams. Ask your health care provider how often you should have your eyes checked.  Personal lifestyle choices, including:  Daily care of your teeth and gums.  Regular physical activity.  Eating a healthy diet.  Avoiding tobacco and drug use.  Limiting alcohol use.  Practicing safe sex.  Taking low-dose aspirin daily starting at age 17.  Taking vitamin and mineral supplements as recommended by your health care provider. What happens during an annual well check? The services and screenings done  by your health care provider during your annual well check will depend on your age, overall health, lifestyle risk factors, and family history of disease. Counseling  Your health care provider may ask you questions about your:  Alcohol use.  Tobacco use.  Drug use.  Emotional well-being.  Home and relationship well-being.  Sexual activity.  Eating habits.  Work and work Statistician.  Method of birth control.  Menstrual cycle.  Pregnancy history. Screening  You may have the following tests or measurements:  Height, weight, and BMI.  Blood pressure.  Lipid and cholesterol levels. These may be checked every 5 years, or more frequently if you are over 3 years old.  Skin check.  Lung cancer screening. You may have this screening every year starting at age 24 if you have a 30-pack-year history of smoking and currently smoke or have quit within the past 15 years.  Fecal occult blood test (FOBT) of the stool. You may have this test every year starting at age 77.  Flexible sigmoidoscopy or colonoscopy. You may have a sigmoidoscopy every 5 years or a colonoscopy every 10 years starting at age 84.  Hepatitis C blood test.  Hepatitis B blood test.  Sexually transmitted disease (STD) testing.  Diabetes screening. This is done by checking your blood sugar (glucose) after you have not eaten for a while (fasting). You may have this done every 1-3 years.  Mammogram. This may be done every 1-2 years. Talk to your health care provider about when you should start having regular mammograms. This may depend on whether you have a family history of breast cancer.  BRCA-related cancer screening. This may  be done if you have a family history of breast, ovarian, tubal, or peritoneal cancers.  Pelvic exam and Pap test. This may be done every 3 years starting at age 88. Starting at age 39, this may be done every 5 years if you have a Pap test in combination with an HPV test.  Bone density  scan. This is done to screen for osteoporosis. You may have this scan if you are at high risk for osteoporosis. Discuss your test results, treatment options, and if necessary, the need for more tests with your health care provider. Vaccines  Your health care provider may recommend certain vaccines, such as:  Influenza vaccine. This is recommended every year.  Tetanus, diphtheria, and acellular pertussis (Tdap, Td) vaccine. You may need a Td booster every 10 years.  Zoster vaccine. You may need this after age 59.  Pneumococcal 13-valent conjugate (PCV13) vaccine. You may need this if you have certain conditions and were not previously vaccinated.  Pneumococcal polysaccharide (PPSV23) vaccine. You may need one or two doses if you smoke cigarettes or if you have certain conditions. Talk to your health care provider about which screenings and vaccines you need and how often you need them. This information is not intended to replace advice given to you by your health care provider. Make sure you discuss any questions you have with your health care provider. Document Released: 10/15/2015 Document Revised: 06/07/2016 Document Reviewed: 07/20/2015 Elsevier Interactive Patient Education  2017 [place] Prevention in the Home Falls can cause injuries. They can happen to people of all ages. There are many things you can do to make your home safe and to help prevent falls. What can I do on the outside of my home?  Regularly fix the edges of walkways and driveways and fix any cracks.  Remove anything that might make you trip as you walk through a door, such as a raised step or threshold.  Trim any bushes or trees on the path to your home.  Use bright outdoor lighting.  Clear any walking paths of anything that might make someone trip, such as rocks or tools.  Regularly check to see if handrails are loose or broken. Make sure that both sides of any steps have handrails.  Any raised  decks and porches should have guardrails on the edges.  Have any leaves, snow, or ice cleared regularly.  Use sand or salt on walking paths during winter.  Clean up any spills in your garage right away. This includes oil or grease spills. What can I do in the bathroom?  Use night lights.  Install grab bars by the toilet and in the tub and shower. Do not use towel bars as grab bars.  Use non-skid mats or decals in the tub or shower.  If you need to sit down in the shower, use a plastic, non-slip stool.  Keep the floor dry. Clean up any water that spills on the floor as soon as it happens.  Remove soap buildup in the tub or shower regularly.  Attach bath mats securely with double-sided non-slip rug tape.  Do not have throw rugs and other things on the floor that can make you trip. What can I do in the bedroom?  Use night lights.  Make sure that you have a light by your bed that is easy to reach.  Do not use any sheets or blankets that are too big for your bed. They should not hang down onto  the floor.  Have a firm chair that has side arms. You can use this for support while you get dressed.  Do not have throw rugs and other things on the floor that can make you trip. What can I do in the kitchen?  Clean up any spills right away.  Avoid walking on wet floors.  Keep items that you use a lot in easy-to-reach places.  If you need to reach something above you, use a strong step stool that has a grab bar.  Keep electrical cords out of the way.  Do not use floor polish or wax that makes floors slippery. If you must use wax, use non-skid floor wax.  Do not have throw rugs and other things on the floor that can make you trip. What can I do with my stairs?  Do not leave any items on the stairs.  Make sure that there are handrails on both sides of the stairs and use them. Fix handrails that are broken or loose. Make sure that handrails are as long as the stairways.  Check  any carpeting to make sure that it is firmly attached to the stairs. Fix any carpet that is loose or worn.  Avoid having throw rugs at the top or bottom of the stairs. If you do have throw rugs, attach them to the floor with carpet tape.  Make sure that you have a light switch at the top of the stairs and the bottom of the stairs. If you do not have them, ask someone to add them for you. What else can I do to help prevent falls?  Wear shoes that:  Do not have high heels.  Have rubber bottoms.  Are comfortable and fit you well.  Are closed at the toe. Do not wear sandals.  If you use a stepladder:  Make sure that it is fully opened. Do not climb a closed stepladder.  Make sure that both sides of the stepladder are locked into place.  Ask someone to hold it for you, if possible.  Clearly mark and make sure that you can see:  Any grab bars or handrails.  First and last steps.  Where the edge of each step is.  Use tools that help you move around (mobility aids) if they are needed. These include:  Canes.  Walkers.  Scooters.  Crutches.  Turn on the lights when you go into a dark area. Replace any light bulbs as soon as they burn out.  Set up your furniture so you have a clear path. Avoid moving your furniture around.  If any of your floors are uneven, fix them.  If there are any pets around you, be aware of where they are.  Review your medicines with your doctor. Some medicines can make you feel dizzy. This can increase your chance of falling. Ask your doctor what other things that you can do to help prevent falls. This information is not intended to replace advice given to you by your health care provider. Make sure you discuss any questions you have with your health care provider. Document Released: 07/15/2009 Document Revised: 02/24/2016 Document Reviewed: 10/23/2014 Elsevier Interactive Patient Education  2017 [employer].

## 2018-06-27 ENCOUNTER — Ambulatory Visit (INDEPENDENT_AMBULATORY_CARE_PROVIDER_SITE_OTHER): Payer: Medicare Other | Admitting: Family Medicine

## 2018-06-27 ENCOUNTER — Encounter: Payer: Self-pay | Admitting: Family Medicine

## 2018-06-27 DIAGNOSIS — M25562 Pain in left knee: Secondary | ICD-10-CM

## 2018-06-27 MED ORDER — DICLOFENAC SODIUM 1 % TD GEL: 4.0000 g | Freq: Four times a day (QID) | TRANSDERMAL | 0 refills | Status: DC
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
## 2018-07-16 ENCOUNTER — Ambulatory Visit (INDEPENDENT_AMBULATORY_CARE_PROVIDER_SITE_OTHER): Payer: Medicare Other

## 2018-07-16 ENCOUNTER — Ambulatory Visit (INDEPENDENT_AMBULATORY_CARE_PROVIDER_SITE_OTHER): Payer: Medicare Other | Admitting: Podiatry

## 2018-07-16 ENCOUNTER — Encounter: Payer: Self-pay | Admitting: Podiatry

## 2018-07-16 DIAGNOSIS — M205X2 Other deformities of toe(s) (acquired), left foot: Secondary | ICD-10-CM

## 2018-07-16 DIAGNOSIS — M7752 Other enthesopathy of left foot: Secondary | ICD-10-CM

## 2018-07-19 NOTE — Progress Notes (Signed)
   HPI: 37 year old female presenting today with a chief complaint of constant pressure, aching and burning pain to the 1st MPJ of the left foot that began 2-3 months ago. She reports associated popping of the joint. Walking increases the symptoms. She has not done anything for treatment. Patient is here for further evaluation and treatment.   Past Medical History:  Diagnosis Date  . Myasthenia gravis [facility])      Physical Exam: General: The patient is alert and oriented x3 in no acute distress.  Dermatology: Skin is warm, dry and supple bilateral lower extremities. Negative for open lesions or macerations.  Vascular: Palpable pedal pulses bilaterally. No edema or erythema noted. Capillary refill within normal limits.  Neurological: Epicritic and protective threshold grossly intact bilaterally.   Musculoskeletal Exam: Pain with palpation noted to the 1st MPJ of the left foot. Range of motion within normal limits to all pedal and ankle joints bilateral. Muscle strength 5/5 in all groups bilateral.   Radiographic Exam:  Normal osseous mineralization. Joint spaces preserved. No fracture/dislocation/boney destruction.    Assessment: 1. 1st MPJ capsulitis left    Plan of Care:  1. Patient evaluated. X-Rays reviewed.  2. Injection of 0.5 mLs Celestone Soluspan injected into the 1st MPJ of the left foot.  3. Continue wearing Sketcher's sneakers.  4. Return to clinic as needed.   Getting her Law Degree.      Felecia Shelling, DPM [facility]  Dr. Felecia Shelling, DPM    [address] [place], [place] [address]                Office [phone]  Fax [phone]

## 2018-10-15 LAB — HM HIV SCREENING LAB: HM HIV Screening: NEGATIVE
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
## 2019-02-28 ENCOUNTER — Ambulatory Visit: Payer: Self-pay | Admitting: Family Medicine

## 2019-02-28 NOTE — Chronic Care Management (AMB) (Signed)
Chronic Care Management   Note  02/28/2019 Name: MIAYA LAFONTANT MRN: [id number] DOB: 12-05-1980  ZILAH VILLAFLOR is a 38 y.o. year old female who is a primary care patient of Steele Sizer, MD. I reached out to [name] by phone today in response to a referral sent by Ms. Dorris L Strength's health plan.    Ms. Kinnick was given information about Chronic Care Management services today including:  1. CCM service includes personalized support from designated clinical staff supervised by her physician, including individualized plan of care and coordination with other care providers 2. 24/7 contact phone numbers for assistance for urgent and routine care needs. 3. Service will only be billed when office clinical staff spend 20 minutes or more in a month to coordinate care. 4. Only one practitioner may furnish and bill the service in a calendar month. 5. The patient may stop CCM services at any time (effective at the end of the month) by phone call to the office staff. 6. The patient will be responsible for cost sharing (co-pay) of up to 20% of the service fee (after annual deductible is met).  Patient did not agree to enrollment in care management services and does not wish to consider at this time.  Follow up plan: The patient has been provided with contact information for the chronic care management team and has been advised to call with any health related questions or concerns.   Fertile  [email or web address]   ??[phone]

## 2019-03-17 ENCOUNTER — Other Ambulatory Visit: Payer: Self-pay

## 2019-03-17 ENCOUNTER — Ambulatory Visit (INDEPENDENT_AMBULATORY_CARE_PROVIDER_SITE_OTHER): Payer: Medicare Other | Admitting: Family Medicine

## 2019-03-17 ENCOUNTER — Encounter: Payer: Self-pay | Admitting: Family Medicine

## 2019-03-17 VITALS — BP 128/84 | HR 78 | Temp 98.2°F | Resp 16 | Ht 64.0 in | Wt 223.6 lb

## 2019-03-17 DIAGNOSIS — M62830 Muscle spasm of back: Secondary | ICD-10-CM

## 2019-03-17 DIAGNOSIS — M25562 Pain in left knee: Secondary | ICD-10-CM

## 2019-03-17 DIAGNOSIS — G8929 Other chronic pain: Secondary | ICD-10-CM | POA: Diagnosis not present

## 2019-03-17 DIAGNOSIS — G7000 Myasthenia gravis without (acute) exacerbation: Secondary | ICD-10-CM

## 2019-03-17 DIAGNOSIS — L308 Other specified dermatitis: Secondary | ICD-10-CM

## 2019-03-17 MED ORDER — MELOXICAM 15 MG PO TABS: 15.0000 mg | ORAL_TABLET | Freq: Every day | ORAL | 0 refills | Status: DC

## 2019-03-17 MED ORDER — DICLOFENAC SODIUM 1 % TD GEL: 4.0000 g | Freq: Four times a day (QID) | TRANSDERMAL | 0 refills | Status: DC

## 2019-03-17 MED ORDER — TRIAMCINOLONE ACETONIDE 0.1 % EX CREA: 1.0000 "application " | TOPICAL_CREAM | Freq: Two times a day (BID) | CUTANEOUS | 0 refills | Status: DC

## 2019-03-17 MED ORDER — BACLOFEN 10 MG PO TABS: 10.0000 mg | ORAL_TABLET | Freq: Three times a day (TID) | ORAL | 0 refills | Status: DC
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
## 2019-04-22 ENCOUNTER — Other Ambulatory Visit: Payer: Self-pay | Admitting: Family Medicine

## 2019-04-22 DIAGNOSIS — E669 Obesity, unspecified: Secondary | ICD-10-CM

## 2019-04-22 DIAGNOSIS — M62830 Muscle spasm of back: Secondary | ICD-10-CM

## 2019-04-22 DIAGNOSIS — Z62810 Personal history of physical and sexual abuse in childhood: Secondary | ICD-10-CM

## 2019-04-24 ENCOUNTER — Other Ambulatory Visit: Payer: Self-pay

## 2019-04-24 ENCOUNTER — Ambulatory Visit: Payer: Self-pay | Admitting: Physician Assistant

## 2019-04-24 ENCOUNTER — Encounter: Payer: Self-pay | Admitting: Physician Assistant

## 2019-04-24 DIAGNOSIS — B373 Candidiasis of vulva and vagina: Secondary | ICD-10-CM

## 2019-04-24 DIAGNOSIS — Z113 Encounter for screening for infections with a predominantly sexual mode of transmission: Secondary | ICD-10-CM

## 2019-04-24 DIAGNOSIS — B3731 Acute candidiasis of vulva and vagina: Secondary | ICD-10-CM

## 2019-04-24 LAB — WET PREP FOR TRICH, YEAST, CLUE: Trichomonas Exam: NEGATIVE

## 2019-04-24 MED ORDER — CLOTRIMAZOLE 1 % VA CREA: 1.0000 | TOPICAL_CREAM | Freq: Every day | VAGINAL | 0 refills | Status: DC

## 2019-04-24 NOTE — Progress Notes (Signed)
Here for STD testing. Accepts bloodwork today.  Hal Morales, RN  Wet prep reviewed by provider C. [place], [place]. Patient treated per provider orders.  Hal Morales, RN
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
## 2019-06-01 ENCOUNTER — Other Ambulatory Visit: Payer: Self-pay | Admitting: Family Medicine

## 2019-06-01 DIAGNOSIS — M62830 Muscle spasm of back: Secondary | ICD-10-CM

## 2019-06-20 ENCOUNTER — Ambulatory Visit: Payer: Medicare Other

## 2019-06-26 ENCOUNTER — Ambulatory Visit (INDEPENDENT_AMBULATORY_CARE_PROVIDER_SITE_OTHER): Payer: Medicare Other

## 2019-06-26 VITALS — Ht 64.0 in | Wt 217.0 lb

## 2019-06-26 DIAGNOSIS — Z0000 Encounter for general adult medical examination without abnormal findings: Secondary | ICD-10-CM | POA: Diagnosis not present

## 2019-06-26 NOTE — Patient Instructions (Signed)
Raven Ray , Thank you for taking time to come for your Medicare Wellness Visit. I appreciate your ongoing commitment to your health goals. Please review the following plan we discussed and let me know if I can assist you in the future.   Screening recommendations/referrals: Recommended yearly ophthalmology/optometry visit for glaucoma screening and checkup Recommended yearly dental visit for hygiene and checkup  Vaccinations: Influenza vaccine: due Tdap vaccine: done 06/10/13    Advanced directives: Advance directive discussed with you today. I have provided a copy for you to complete at home and have notarized. Once this is complete please bring a copy in to our office so we can scan it into your chart.  Conditions/risks identified: Recommend increasing physical activity  Next appointment: Please follow up in one year for your Medicare Annual Wellness visit.    Preventive Care 40-64 Years, Female Preventive care refers to lifestyle choices and visits with your health care provider that can promote health and wellness. What does preventive care include?  A yearly physical exam. This is also called an annual well check.  Dental exams once or twice a year.  Routine eye exams. Ask your health care provider how often you should have your eyes checked.  Personal lifestyle choices, including:  Daily care of your teeth and gums.  Regular physical activity.  Eating a healthy diet.  Avoiding tobacco and drug use.  Limiting alcohol use.  Practicing safe sex.  Taking low-dose aspirin daily starting at age 54.  Taking vitamin and mineral supplements as recommended by your health care provider. What happens during an annual well check? The services and screenings done by your health care provider during your annual well check will depend on your age, overall health, lifestyle risk factors, and family history of disease. Counseling  Your health care provider may ask you questions  about your:  Alcohol use.  Tobacco use.  Drug use.  Emotional well-being.  Home and relationship well-being.  Sexual activity.  Eating habits.  Work and work Statistician.  Method of birth control.  Menstrual cycle.  Pregnancy history. Screening  You may have the following tests or measurements:  Height, weight, and BMI.  Blood pressure.  Lipid and cholesterol levels. These may be checked every 5 years, or more frequently if you are over 67 years old.  Skin check.  Lung cancer screening. You may have this screening every year starting at age 36 if you have a 30-pack-year history of smoking and currently smoke or have quit within the past 15 years.  Fecal occult blood test (FOBT) of the stool. You may have this test every year starting at age 34.  Flexible sigmoidoscopy or colonoscopy. You may have a sigmoidoscopy every 5 years or a colonoscopy every 10 years starting at age 63.  Hepatitis C blood test.  Hepatitis B blood test.  Sexually transmitted disease (STD) testing.  Diabetes screening. This is done by checking your blood sugar (glucose) after you have not eaten for a while (fasting). You may have this done every 1-3 years.  Mammogram. This may be done every 1-2 years. Talk to your health care provider about when you should start having regular mammograms. This may depend on whether you have a family history of breast cancer.  BRCA-related cancer screening. This may be done if you have a family history of breast, ovarian, tubal, or peritoneal cancers.  Pelvic exam and Pap test. This may be done every 3 years starting at age 8. Starting at age 32,  this may be done every 5 years if you have a Pap test in combination with an HPV test.  Bone density scan. This is done to screen for osteoporosis. You may have this scan if you are at high risk for osteoporosis. Discuss your test results, treatment options, and if necessary, the need for more tests with your  health care provider. Vaccines  Your health care provider may recommend certain vaccines, such as:  Influenza vaccine. This is recommended every year.  Tetanus, diphtheria, and acellular pertussis (Tdap, Td) vaccine. You may need a Td booster every 10 years.  Zoster vaccine. You may need this after age 82.  Pneumococcal 13-valent conjugate (PCV13) vaccine. You may need this if you have certain conditions and were not previously vaccinated.  Pneumococcal polysaccharide (PPSV23) vaccine. You may need one or two doses if you smoke cigarettes or if you have certain conditions. Talk to your health care provider about which screenings and vaccines you need and how often you need them. This information is not intended to replace advice given to you by your health care provider. Make sure you discuss any questions you have with your health care provider. Document Released: 10/15/2015 Document Revised: 06/07/2016 Document Reviewed: 07/20/2015 Elsevier Interactive Patient Education  2017 [place] Prevention in the Home Falls can cause injuries. They can happen to people of all ages. There are many things you can do to make your home safe and to help prevent falls. What can I do on the outside of my home?  Regularly fix the edges of walkways and driveways and fix any cracks.  Remove anything that might make you trip as you walk through a door, such as a raised step or threshold.  Trim any bushes or trees on the path to your home.  Use bright outdoor lighting.  Clear any walking paths of anything that might make someone trip, such as rocks or tools.  Regularly check to see if handrails are loose or broken. Make sure that both sides of any steps have handrails.  Any raised decks and porches should have guardrails on the edges.  Have any leaves, snow, or ice cleared regularly.  Use sand or salt on walking paths during winter.  Clean up any spills in your garage right away.  This includes oil or grease spills. What can I do in the bathroom?  Use night lights.  Install grab bars by the toilet and in the tub and shower. Do not use towel bars as grab bars.  Use non-skid mats or decals in the tub or shower.  If you need to sit down in the shower, use a plastic, non-slip stool.  Keep the floor dry. Clean up any water that spills on the floor as soon as it happens.  Remove soap buildup in the tub or shower regularly.  Attach bath mats securely with double-sided non-slip rug tape.  Do not have throw rugs and other things on the floor that can make you trip. What can I do in the bedroom?  Use night lights.  Make sure that you have a light by your bed that is easy to reach.  Do not use any sheets or blankets that are too big for your bed. They should not hang down onto the floor.  Have a firm chair that has side arms. You can use this for support while you get dressed.  Do not have throw rugs and other things on the floor that can make  you trip. What can I do in the kitchen?  Clean up any spills right away.  Avoid walking on wet floors.  Keep items that you use a lot in easy-to-reach places.  If you need to reach something above you, use a strong step stool that has a grab bar.  Keep electrical cords out of the way.  Do not use floor polish or wax that makes floors slippery. If you must use wax, use non-skid floor wax.  Do not have throw rugs and other things on the floor that can make you trip. What can I do with my stairs?  Do not leave any items on the stairs.  Make sure that there are handrails on both sides of the stairs and use them. Fix handrails that are broken or loose. Make sure that handrails are as long as the stairways.  Check any carpeting to make sure that it is firmly attached to the stairs. Fix any carpet that is loose or worn.  Avoid having throw rugs at the top or bottom of the stairs. If you do have throw rugs, attach them  to the floor with carpet tape.  Make sure that you have a light switch at the top of the stairs and the bottom of the stairs. If you do not have them, ask someone to add them for you. What else can I do to help prevent falls?  Wear shoes that:  Do not have high heels.  Have rubber bottoms.  Are comfortable and fit you well.  Are closed at the toe. Do not wear sandals.  If you use a stepladder:  Make sure that it is fully opened. Do not climb a closed stepladder.  Make sure that both sides of the stepladder are locked into place.  Ask someone to hold it for you, if possible.  Clearly mark and make sure that you can see:  Any grab bars or handrails.  First and last steps.  Where the edge of each step is.  Use tools that help you move around (mobility aids) if they are needed. These include:  Canes.  Walkers.  Scooters.  Crutches.  Turn on the lights when you go into a dark area. Replace any light bulbs as soon as they burn out.  Set up your furniture so you have a clear path. Avoid moving your furniture around.  If any of your floors are uneven, fix them.  If there are any pets around you, be aware of where they are.  Review your medicines with your doctor. Some medicines can make you feel dizzy. This can increase your chance of falling. Ask your doctor what other things that you can do to help prevent falls. This information is not intended to replace advice given to you by your health care provider. Make sure you discuss any questions you have with your health care provider. Document Released: 07/15/2009 Document Revised: 02/24/2016 Document Reviewed: 10/23/2014 Elsevier Interactive Patient Education  2017 [employer].

## 2019-06-26 NOTE — Progress Notes (Signed)
Subjective:   Raven Ray is a 38 y.o. female who presents for Medicare Annual (Subsequent) preventive examination.  Virtual Visit via Telephone Note  I connected with [name] on 06/26/19 at 10:00 AM EDT by telephone and verified that I am speaking with the correct person using two identifiers.  Medicare Annual Wellness visit completed telephonically due to Covid-19 pandemic.   Location: Patient: home Provider: office   I discussed the limitations, risks, security and privacy concerns of performing an evaluation and management service by telephone and the availability of in person appointments. The patient expressed understanding and agreed to proceed.  Some vital signs may be absent or patient reported.   [name], LPN     Review of Systems:         Objective:     Vitals: Ht 5\' 4"  (1.626 m)   Wt 217 lb (98.4 kg)   BMI 37.25 kg/m   Body mass index is 37.25 kg/m.  Advanced Directives 06/18/2018 06/14/2017 06/11/2017 09/06/2016  Does Patient Have a Medical Advance Directive? No No No No  Would patient like information on creating a medical advance directive? Yes (MAU/Ambulatory/Procedural Areas - Information given) - - -    Tobacco Social History   Tobacco Use  Smoking Status Never Smoker  Smokeless Tobacco Never Used  Tobacco Comment   smoking cessation materials not required     Counseling given: Not Answered Comment: smoking cessation materials not required   Clinical Intake:  Pre-visit preparation completed: Yes  Pain : No/denies pain     BMI - recorded: 37.25 Nutritional Status: BMI > 30  Obese Nutritional Risks: None Diabetes: No  How often do you need to have someone help you when you read instructions, pamphlets, or other written materials from your doctor or pharmacy?: 1 - Never  Interpreter Needed?: No  Information entered by :: [name] LPN  Past Medical History:  Diagnosis Date  . Myasthenia gravis [facility])    Past  Surgical History:  Procedure Laterality Date  . CESAREAN SECTION  10/10/1999  . TOTAL THYMECTOMY  1991   Family History  Problem Relation Age of Onset  . Hypertension Mother   . Migraines Mother   . Hypertension Father   . Heart disease Father   . Diabetes Maternal Grandmother    Social History   Socioeconomic History  . Marital status: Single    Spouse name: Not on file  . Number of children: 2  . Years of education: some college  . Highest education level: 12th grade  Occupational History  . Occupation: Disabled  Social Needs  . Financial resource strain: Not hard at all  . Food insecurity    Worry: Never true    Inability: Never true  . Transportation needs    Medical: No    Non-medical: No  Tobacco Use  . Smoking status: Never Smoker  . Smokeless tobacco: Never Used  . Tobacco comment: smoking cessation materials not required  Substance and Sexual Activity  . Alcohol use: No  . Drug use: No  . Sexual activity: Not Currently    Birth control/protection: Injection  Lifestyle  . Physical activity    Days per week: 0 days    Minutes per session: 0 min  . Stress: Not at all  Relationships  . Social connections    Talks on phone: More than three times a week    Gets together: Twice a week    Attends religious service: 1 to  4 times per year    Active member of club or organization: No    Attends meetings of clubs or organizations: Never    Relationship status: Never married  Other Topics Concern  . 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 diclofenac sodium (VOLTAREN) 1 % GEL Apply 4 g topically 4 (four) times daily.  . meloxicam (MOBIC) 15 MG tablet TAKE 1 TABLET BY MOUTH EVERY DAY  . NUVARING 0.12-0.015  MG/24HR vaginal ring PLACE ONE RING PER VAGINA EVERY 3 WEEKS AND REMOVE FOR 4TH WEEK AND REPEAT  . omeprazole (PRILOSEC) 20 MG capsule Take by mouth daily. PRN only  . Cholecalciferol (VITAMIN D-1000 MAX ST) 1000 units tablet Take by mouth.  . pyridostigmine (MESTINON) 60 MG tablet Take by mouth.  . [DISCONTINUED] baclofen (LIORESAL) 10 MG tablet Take 1 tablet (10 mg total) by mouth 3 (three) times daily.  . [DISCONTINUED] clotrimazole (GYNE-LOTRIMIN) 1 % vaginal cream Place 1 Applicatorful vaginally at bedtime.  . [DISCONTINUED] triamcinolone cream (KENALOG) 0.1 % Apply 1 application topically 2 (two) times daily.  . [DISCONTINUED] vitamin B-12 (CYANOCOBALAMIN) 1000 MCG tablet Take by mouth.   No facility-administered encounter medications on file as of 06/26/2019.     Activities of Daily Living In your present state of health, do you have any difficulty performing the following activities: 03/17/2019 06/27/2018  Hearing? N N  Comment - denies hearing aids  Vision? Y N  Comment - wears eyeglasses  Difficulty concentrating or making decisions? N N  Walking or climbing stairs? N N  Dressing or bathing? N N  Doing errands, shopping? N N  Some recent data might be hidden    Patient Care Team: Alba CorySowles, Krichna, MD as PCP - General (Family Medicine) Lonell FaceShah, Hemang K, MD as Consulting Physician (Neurology)    Assessment:   This is a routine wellness examination for Raven Ray.  Exercise Activities and Dietary recommendations    Goals    . DIET - INCREASE WATER INTAKE     Recommend to drink at least 6-8 8oz glasses of water per day.    . Exercise 3x per week (30 min per time)     Recommend increasing exercise to 3 times a week for at least 30 minutes.        Fall Risk Fall Risk  03/17/2019 06/27/2018 06/18/2018 03/08/2018 12/14/2017  Falls in the past year? 0 No No No No  Number falls in past yr: 0 - - - -  Injury with Fall? 0 - - - -  Risk for fall due to : - - Impaired vision - -  Risk  for fall due to: Comment - - wears eyeglasses; myasthenia gravis - -   FALL RISK PREVENTION PERTAINING TO THE HOME:  Any stairs in or around the home? No  If so, do they handrails? No   Home free of loose throw rugs in walkways, pet beds, electrical cords, etc? Yes  Adequate lighting in your home to reduce risk of falls? Yes   ASSISTIVE DEVICES UTILIZED TO PREVENT FALLS:  Life alert? No  Use of a cane, walker or w/c? No  Grab bars in  the bathroom? Yes  Shower chair or bench in shower? No Elevated toilet seat or a handicapped toilet? No   DME ORDERS:  DME order needed?  No   TIMED UP AND GO:  Was the test performed? No . Telephonic visit.   Education: Fall risk prevention has been discussed.  Intervention(s) required? No   Depression Screen PHQ 2/9 Scores 03/17/2019 06/27/2018 06/18/2018 12/14/2017  PHQ - 2 Score 0 0 0 0  PHQ- 9 Score 0 0 0 -     Cognitive Function     6CIT Screen 06/18/2018  What Year? 0 points  What month? 0 points  What time? 0 points  Count back from 20 0 points  Months in reverse 0 points  Repeat phrase 0 points  Total Score 0    Immunization History  Administered Date(s) Administered  . Influenza,inj,Quad PF,6+ Mos 06/14/2017    Qualifies for Shingles Vaccine? No    Tdap: Although this vaccine is not a covered service during a Wellness Exam, does the patient still wish to receive this vaccine today?  No .  Education has been provided regarding the importance of this vaccine. Advised may receive this vaccine at local pharmacy or Health Dept. Aware to provide a copy of the vaccination record if obtained from local pharmacy or Health Dept. Verbalized acceptance and understanding.  Flu Vaccine: Due for Flu vaccine. Does the patient want to receive this vaccine today?  No . Education has been provided regarding the importance of this vaccine but still declined. Advised may receive this vaccine at local pharmacy or Health Dept. Aware to provide a  copy of the vaccination record if obtained from local pharmacy or Health Dept. Verbalized acceptance and understanding.  Pneumococcal Vaccine: n/a   Screening Tests Health Maintenance  Topic Date Due  . INFLUENZA VACCINE  05/03/2019  . PAP SMEAR-Modifier  10/03/2020  . TETANUS/TDAP  06/11/2023  . HIV Screening  Completed    Cancer Screenings:  Colorectal Screening: n/a  Mammogram: n/a  Bone Density: n/a  Lung Cancer Screening: (Low Dose CT Chest recommended if Age 50-80 years, 30 pack-year currently smoking OR have quit w/in 15years.) does not qualify.    Additional Screening:  Hepatitis C Screening: does not qualify;   Vision Screening: Recommended annual ophthalmology exams for early detection of glaucoma and other disorders of the eye. Is the patient up to date with their annual eye exam?  Yes  Who is the provider or what is the name of the office in which the pt attends annual eye exams? [facility]  Dental Screening: Recommended annual dental exams for proper oral hygiene  Community Resource Referral:  CRR required this visit?  No      Plan:     I have personally reviewed and addressed the Medicare Annual Wellness questionnaire and have noted the following in the patient's chart:  A. Medical and social history B. Use of alcohol, tobacco or illicit drugs  C. Current medications and supplements D. Functional ability and status E.  Nutritional status F.  Physical activity G. Advance directives H. List of other physicians I.  Hospitalizations, surgeries, and ER visits in previous 12 months J.  Vitals K. Screenings such as hearing and vision if needed, cognitive and depression L. Referrals and appointments   In addition, I have reviewed and discussed with patient certain preventive protocols, quality metrics, and best practice recommendations. A written personalized care plan for preventive services as well as general preventive health recommendations  were provided to patient.   Signed,  [name], LPN Nurse Health Advisor   Nurse Notes: pt states she needs handicap sticker renewed. Advised to schedule appt with Dr. Carlynn Purl, due for CPE.

## 2019-07-06 ENCOUNTER — Other Ambulatory Visit: Payer: Self-pay | Admitting: Family Medicine

## 2019-07-06 DIAGNOSIS — M62830 Muscle spasm of back: Secondary | ICD-10-CM

## 2019-07-08 ENCOUNTER — Other Ambulatory Visit: Payer: Self-pay

## 2019-07-08 ENCOUNTER — Ambulatory Visit: Payer: Self-pay | Admitting: Family Medicine

## 2019-07-08 ENCOUNTER — Encounter: Payer: Self-pay | Admitting: Family Medicine

## 2019-07-08 DIAGNOSIS — Z113 Encounter for screening for infections with a predominantly sexual mode of transmission: Secondary | ICD-10-CM

## 2019-07-08 DIAGNOSIS — B379 Candidiasis, unspecified: Secondary | ICD-10-CM

## 2019-07-08 LAB — WET PREP FOR TRICH, YEAST, CLUE: Trichomonas Exam: NEGATIVE

## 2019-07-08 MED ORDER — CLOTRIMAZOLE 1 % VA CREA: 1.0000 | TOPICAL_CREAM | Freq: Every day | VAGINAL | 0 refills | Status: AC

## 2019-07-08 NOTE — Progress Notes (Signed)
Wet mount reviewed with provider and pt treated for yeast with Clotrimazole Vaginal cream per Hassell Done, FNP verbal order. Provider orders completed.Ronny Bacon, RN
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
## 2019-10-10 ENCOUNTER — Encounter: Payer: Self-pay | Admitting: Physician Assistant

## 2019-10-10 ENCOUNTER — Ambulatory Visit (LOCAL_COMMUNITY_HEALTH_CENTER): Payer: Medicare Other | Admitting: Physician Assistant

## 2019-10-10 ENCOUNTER — Other Ambulatory Visit: Payer: Self-pay

## 2019-10-10 VITALS — BP 116/72 | Ht 62.0 in | Wt 216.2 lb

## 2019-10-10 DIAGNOSIS — Z113 Encounter for screening for infections with a predominantly sexual mode of transmission: Secondary | ICD-10-CM | POA: Diagnosis not present

## 2019-10-10 DIAGNOSIS — Z3049 Encounter for surveillance of other contraceptives: Secondary | ICD-10-CM

## 2019-10-10 DIAGNOSIS — Z3009 Encounter for other general counseling and advice on contraception: Secondary | ICD-10-CM | POA: Diagnosis not present

## 2019-10-10 LAB — WET PREP FOR TRICH, YEAST, CLUE
Trichomonas Exam: NEGATIVE
Yeast Exam: NEGATIVE

## 2019-10-10 MED ORDER — ETONOGESTREL-ETHINYL ESTRADIOL 0.12-0.015 MG/24HR VA RING: VAGINAL_RING | VAGINAL | 12 refills | Status: DC

## 2019-10-10 NOTE — Progress Notes (Signed)
Patient here for yearly PE and STD testing. Using nuva ring for BCM. Last Pap test 10/03/2017, normal, and negative HPV.Marland KitchenBurt Knack, RN

## 2019-10-10 NOTE — Progress Notes (Signed)
Wet mount reviewed, no treatment indicated. Patient phone number and pharmacy verified.Burt Knack, RN

## 2019-10-13 ENCOUNTER — Encounter: Payer: Self-pay | Admitting: Physician Assistant
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
## 2020-02-16 ENCOUNTER — Ambulatory Visit: Payer: Medicare Other

## 2020-02-19 ENCOUNTER — Ambulatory Visit: Payer: Medicare Other

## 2020-02-19 ENCOUNTER — Encounter: Payer: Self-pay | Admitting: Advanced Practice Midwife

## 2020-02-19 ENCOUNTER — Other Ambulatory Visit: Payer: Self-pay

## 2020-02-19 ENCOUNTER — Ambulatory Visit: Payer: Self-pay | Admitting: Advanced Practice Midwife

## 2020-02-19 DIAGNOSIS — Z113 Encounter for screening for infections with a predominantly sexual mode of transmission: Secondary | ICD-10-CM

## 2020-02-19 DIAGNOSIS — N760 Acute vaginitis: Secondary | ICD-10-CM

## 2020-02-19 DIAGNOSIS — B9689 Other specified bacterial agents as the cause of diseases classified elsewhere: Secondary | ICD-10-CM

## 2020-02-19 DIAGNOSIS — B379 Candidiasis, unspecified: Secondary | ICD-10-CM

## 2020-02-19 LAB — WET PREP FOR TRICH, YEAST, CLUE: Trichomonas Exam: NEGATIVE

## 2020-02-19 MED ORDER — METRONIDAZOLE 500 MG PO TABS: 500.0000 mg | ORAL_TABLET | Freq: Two times a day (BID) | ORAL | 0 refills | Status: AC

## 2020-02-19 MED ORDER — CLOTRIMAZOLE 1 % VA CREA: 1.0000 | TOPICAL_CREAM | Freq: Every day | VAGINAL | 0 refills | Status: AC

## 2020-02-19 NOTE — Progress Notes (Signed)
Wet mount reviewed, patient treated for BV and yeast. Counseled to take Metronidazole and then use Clotrimazole for 7 nights.
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
## 2020-05-17 ENCOUNTER — Encounter: Payer: Self-pay | Admitting: Family Medicine

## 2020-05-17 ENCOUNTER — Other Ambulatory Visit (HOSPITAL_COMMUNITY)
Admission: RE | Admit: 2020-05-17 | Discharge: 2020-05-17 | Disposition: A | Discharge status: Home / Self Care | Payer: Medicare Other | Source: Ambulatory Visit | Attending: Family Medicine | Admitting: Family Medicine

## 2020-05-17 ENCOUNTER — Other Ambulatory Visit: Payer: Self-pay

## 2020-05-17 ENCOUNTER — Ambulatory Visit (INDEPENDENT_AMBULATORY_CARE_PROVIDER_SITE_OTHER): Payer: Medicare Other | Admitting: Family Medicine

## 2020-05-17 ENCOUNTER — Telehealth: Payer: Self-pay | Admitting: Family Medicine

## 2020-05-17 VITALS — BP 140/100 | HR 96 | Temp 98.0°F | Resp 16 | Ht 62.0 in | Wt 212.2 lb

## 2020-05-17 DIAGNOSIS — G7000 Myasthenia gravis without (acute) exacerbation: Secondary | ICD-10-CM | POA: Diagnosis not present

## 2020-05-17 DIAGNOSIS — Z131 Encounter for screening for diabetes mellitus: Secondary | ICD-10-CM

## 2020-05-17 DIAGNOSIS — R739 Hyperglycemia, unspecified: Secondary | ICD-10-CM | POA: Diagnosis not present

## 2020-05-17 DIAGNOSIS — E559 Vitamin D deficiency, unspecified: Secondary | ICD-10-CM

## 2020-05-17 DIAGNOSIS — Z1159 Encounter for screening for other viral diseases: Secondary | ICD-10-CM

## 2020-05-17 DIAGNOSIS — M25562 Pain in left knee: Secondary | ICD-10-CM | POA: Diagnosis not present

## 2020-05-17 DIAGNOSIS — G8929 Other chronic pain: Secondary | ICD-10-CM

## 2020-05-17 DIAGNOSIS — M62830 Muscle spasm of back: Secondary | ICD-10-CM | POA: Diagnosis not present

## 2020-05-17 DIAGNOSIS — Z113 Encounter for screening for infections with a predominantly sexual mode of transmission: Secondary | ICD-10-CM | POA: Insufficient documentation

## 2020-05-17 DIAGNOSIS — Z13220 Encounter for screening for lipoid disorders: Secondary | ICD-10-CM | POA: Diagnosis not present

## 2020-05-17 DIAGNOSIS — L308 Other specified dermatitis: Secondary | ICD-10-CM

## 2020-05-17 MED ORDER — TRIAMCINOLONE ACETONIDE 0.1 % EX CREA: 1.0000 "application " | TOPICAL_CREAM | Freq: Two times a day (BID) | CUTANEOUS | 0 refills | Status: DC
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
## 2020-05-17 NOTE — Telephone Encounter (Signed)
Pt requesting a return call has a question concerning her knee. Stated that you guys had talked about it in the room during her appt

## 2020-05-17 NOTE — Telephone Encounter (Signed)
Called patient. Patient aware.

## 2020-05-18 LAB — COMPLETE METABOLIC PANEL WITH GFR
AG Ratio: 1.4 (calc) (ref 1.0–2.5)
ALT: 9 U/L (ref 6–29)
AST: 15 U/L (ref 10–30)
Albumin: 4.2 g/dL (ref 3.6–5.1)
Alkaline phosphatase (APISO): 44 U/L (ref 31–125)
BUN: 19 mg/dL (ref 7–25)
CO2: 25 mmol/L (ref 20–32)
Calcium: 9 mg/dL (ref 8.6–10.2)
Chloride: 105 mmol/L (ref 98–110)
Creat: 0.67 mg/dL (ref 0.50–1.10)
GFR, Est African American: 128 mL/min/{1.73_m2} (ref >=60)
GFR, Est Non African American: 111 mL/min/{1.73_m2} (ref >=60)
Globulin: 3.1 g/dL (calc) (ref 1.9–3.7)
Glucose, Bld: 82 mg/dL (ref 65–99)
Potassium: 3.9 mmol/L (ref 3.5–5.3)
Sodium: 137 mmol/L (ref 135–146)
Total Bilirubin: 0.4 mg/dL (ref 0.2–1.2)
Total Protein: 7.3 g/dL (ref 6.1–8.1)

## 2020-05-18 LAB — CBC WITH DIFFERENTIAL/PLATELET
Absolute Monocytes: 271 cells/uL (ref 200–950)
Basophils Absolute: 21 cells/uL (ref 0–200)
Basophils Relative: 0.5 %
Eosinophils Absolute: 201 cells/uL (ref 15–500)
Eosinophils Relative: 4.9 %
HCT: 40.7 % (ref 35.0–45.0)
Hemoglobin: 13.6 g/dL (ref 11.7–15.5)
Lymphs Abs: 1607 cells/uL (ref 850–3900)
MCH: 29.4 pg (ref 27.0–33.0)
MCHC: 33.4 g/dL (ref 32.0–36.0)
MCV: 88.1 fL (ref 80.0–100.0)
MPV: 10.2 fL (ref 7.5–12.5)
Monocytes Relative: 6.6 %
Neutro Abs: 2001 cells/uL (ref 1500–7800)
Neutrophils Relative %: 48.8 %
Platelets: 223 10*3/uL (ref 140–400)
RBC: 4.62 10*6/uL (ref 3.80–5.10)
RDW: 12.4 % (ref 11.0–15.0)
Total Lymphocyte: 39.2 %
WBC: 4.1 10*3/uL (ref 3.8–10.8)

## 2020-05-18 LAB — ABN TEST REFUSAL: ABN TEST REFUSED: 760016802

## 2020-05-18 LAB — HEPATITIS C ANTIBODY
Hepatitis C Ab: NONREACTIVE
SIGNAL TO CUT-OFF: 0.02 (ref <1.00)

## 2020-05-18 LAB — VITAMIN D 25 HYDROXY (VIT D DEFICIENCY, FRACTURES): Vit D, 25-Hydroxy: 13 ng/mL — ABNORMAL LOW (ref 30–100)

## 2020-05-18 LAB — CERVICOVAGINAL ANCILLARY ONLY
Chlamydia: NEGATIVE
Comment: NEGATIVE
Comment: NORMAL
Neisseria Gonorrhea: NEGATIVE

## 2020-05-18 LAB — RPR: RPR Ser Ql: NONREACTIVE

## 2020-05-19 ENCOUNTER — Other Ambulatory Visit: Payer: Self-pay | Admitting: Family Medicine

## 2020-05-19 DIAGNOSIS — E559 Vitamin D deficiency, unspecified: Secondary | ICD-10-CM

## 2020-05-19 MED ORDER — VITAMIN D (ERGOCALCIFEROL) 1.25 MG (50000 UNIT) PO CAPS: 50000.0000 [IU] | ORAL_CAPSULE | ORAL | 1 refills | Status: DC

## 2020-05-21 ENCOUNTER — Other Ambulatory Visit: Payer: Self-pay

## 2020-05-21 ENCOUNTER — Ambulatory Visit: Payer: Self-pay | Admitting: Physician Assistant

## 2020-05-21 ENCOUNTER — Encounter: Payer: Self-pay | Admitting: Physician Assistant

## 2020-05-21 DIAGNOSIS — B3731 Acute candidiasis of vulva and vagina: Secondary | ICD-10-CM

## 2020-05-21 DIAGNOSIS — B373 Candidiasis of vulva and vagina: Secondary | ICD-10-CM

## 2020-05-21 DIAGNOSIS — Z113 Encounter for screening for infections with a predominantly sexual mode of transmission: Secondary | ICD-10-CM

## 2020-05-21 MED ORDER — CLOTRIMAZOLE 1 % VA CREA: 1.0000 | TOPICAL_CREAM | Freq: Every day | VAGINAL | 0 refills | Status: DC
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
## 2020-05-21 NOTE — Progress Notes (Signed)
Wet mount reviewed, pt treated for yeast per provider order. Provider orders completed.

## 2020-05-24 LAB — WET PREP FOR TRICH, YEAST, CLUE: Trichomonas Exam: NEGATIVE

## 2020-05-25 DIAGNOSIS — M222X2 Patellofemoral disorders, left knee: Secondary | ICD-10-CM | POA: Diagnosis not present

## 2020-05-25 DIAGNOSIS — M222X1 Patellofemoral disorders, right knee: Secondary | ICD-10-CM | POA: Diagnosis not present

## 2020-05-25 LAB — GONOCOCCUS CULTURE

## 2020-06-10 DIAGNOSIS — M25562 Pain in left knee: Secondary | ICD-10-CM | POA: Diagnosis not present

## 2020-06-10 DIAGNOSIS — M25561 Pain in right knee: Secondary | ICD-10-CM | POA: Diagnosis not present

## 2020-06-14 DIAGNOSIS — M25562 Pain in left knee: Secondary | ICD-10-CM | POA: Diagnosis not present

## 2020-06-14 DIAGNOSIS — M25561 Pain in right knee: Secondary | ICD-10-CM | POA: Diagnosis not present

## 2020-06-18 DIAGNOSIS — M25561 Pain in right knee: Secondary | ICD-10-CM | POA: Diagnosis not present

## 2020-06-18 DIAGNOSIS — M25562 Pain in left knee: Secondary | ICD-10-CM | POA: Diagnosis not present

## 2020-06-21 DIAGNOSIS — M25561 Pain in right knee: Secondary | ICD-10-CM | POA: Diagnosis not present

## 2020-06-21 DIAGNOSIS — M25562 Pain in left knee: Secondary | ICD-10-CM | POA: Diagnosis not present

## 2020-06-25 DIAGNOSIS — M25561 Pain in right knee: Secondary | ICD-10-CM | POA: Diagnosis not present

## 2020-06-25 DIAGNOSIS — M25562 Pain in left knee: Secondary | ICD-10-CM | POA: Diagnosis not present

## 2020-06-28 DIAGNOSIS — M25561 Pain in right knee: Secondary | ICD-10-CM | POA: Diagnosis not present

## 2020-06-28 DIAGNOSIS — M25562 Pain in left knee: Secondary | ICD-10-CM | POA: Diagnosis not present

## 2020-07-02 DIAGNOSIS — M25562 Pain in left knee: Secondary | ICD-10-CM | POA: Diagnosis not present

## 2020-07-02 DIAGNOSIS — M25561 Pain in right knee: Secondary | ICD-10-CM | POA: Diagnosis not present

## 2020-07-05 DIAGNOSIS — M25562 Pain in left knee: Secondary | ICD-10-CM | POA: Diagnosis not present

## 2020-07-05 DIAGNOSIS — M25561 Pain in right knee: Secondary | ICD-10-CM | POA: Diagnosis not present

## 2020-07-06 ENCOUNTER — Ambulatory Visit (INDEPENDENT_AMBULATORY_CARE_PROVIDER_SITE_OTHER): Payer: Medicare Other

## 2020-07-06 DIAGNOSIS — Z0000 Encounter for general adult medical examination without abnormal findings: Secondary | ICD-10-CM

## 2020-07-06 NOTE — Progress Notes (Signed)
Subjective:   Raven Ray is a 39 y.o. female who presents for Medicare Annual (Subsequent) preventive examination.  Virtual Visit via Telephone Note  I connected with  Raven Ray on 07/06/20 at  2:50 PM EDT by telephone and verified that I am speaking with the correct person using two identifiers.  Medicare Annual Wellness visit completed telephonically due to Covid-19 pandemic.   Location: Patient: home Provider: [facility]   I discussed the limitations, risks, security and privacy concerns of performing an evaluation and management service by telephone and the availability of in person appointments. The patient expressed understanding and agreed to proceed.  Unable to perform video visit due to video visit attempted and failed and/or patient does not have video capability.   Some vital signs may be absent or patient reported.   Raven LittlerKasey Eleyna Brugh, LPN    Review of Systems     Cardiac Risk Factors include: obesity (BMI >30kg/m2)     Objective:    There were no vitals filed for this visit. There is no height or weight on file to calculate BMI.  Advanced Directives 07/06/2020 06/26/2019 06/18/2018 06/14/2017 06/11/2017 09/06/2016  Does Patient Have a Medical Advance Directive? No No No No No No  Would patient like information on creating a medical advance directive? Yes (MAU/Ambulatory/Procedural Areas - Information given) Yes (MAU/Ambulatory/Procedural Areas - Information given) Yes (MAU/Ambulatory/Procedural Areas - Information given) - - -    Current Medications (verified) Outpatient Encounter Medications as of 07/06/2020  Medication Sig  . diclofenac sodium (VOLTAREN) 1 % GEL Apply 4 g topically 4 (four) times daily.  Marland Kitchen. etonogestrel-ethinyl estradiol (NUVARING) 0.12-0.015 MG/24HR vaginal ring Insert vaginally and leave in place for 3 consecutive weeks, then remove for 1 week.  . meloxicam (MOBIC) 15 MG tablet TAKE 1 TABLET BY MOUTH EVERY DAY  . triamcinolone cream (KENALOG)  0.1 % Apply 1 application topically 2 (two) times daily.  . Vitamin D, Ergocalciferol, (DRISDOL) 1.25 MG (50000 UNIT) CAPS capsule Take 1 capsule (50,000 Units total) by mouth every 7 (seven) days.  . Cholecalciferol (VITAMIN D-1000 MAX ST) 1000 units tablet Take by mouth. (Patient not taking: Reported on 05/17/2020)  . omeprazole (PRILOSEC) 20 MG capsule Take by mouth daily. PRN only (Patient not taking: Reported on 07/06/2020)  . pyridostigmine (MESTINON) 60 MG tablet Take by mouth.  . [DISCONTINUED] clotrimazole (CLOTRIMAZOLE-7) 1 % vaginal cream Place 1 Applicatorful vaginally at bedtime.   No facility-administered encounter medications on file as of 07/06/2020.    Allergies (verified) Patient has no known allergies.   History: Past Medical History:  Diagnosis Date  . GERD (gastroesophageal reflux disease) 03/08/2018  . Myasthenia gravis [facility](HCC)    Past Surgical History:  Procedure Laterality Date  . CESAREAN SECTION  10/10/1999  . TOTAL THYMECTOMY  1991   Family History  Problem Relation Age of Onset  . Hypertension Mother   . Migraines Mother   . Hypertension Father   . Heart disease Father   . Diabetes Maternal Grandmother    Social History   Socioeconomic History  . Marital status: Single    Spouse name: Not on file  . Number of children: 2  . Years of education: some college  . Highest education level: Associate degree: occupational, Scientist, product/process developmenttechnical, or vocational program  Occupational History  . Occupation: Disabled  Tobacco Use  . Smoking status: Former Games developermoker  . Smokeless tobacco: Never Used  . Tobacco comment: smoking cessation materials not required  Vaping Use  . Vaping  Use: Never used  Substance and Sexual Activity  . Alcohol use: No  . Drug use: No  . Sexual activity: Not Currently    Birth control/protection: Other-see comments    Comment: Nuva Ring  Other Topics Concern  . Not on file  Social History Narrative   She is on disability but she states she  went back to work part time to not feel disabled. 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       She completed school to become a para-legal       Social Determinants of Health   Financial Resource Strain: Low Risk   . Difficulty of Paying Living Expenses: Not hard at all  Food Insecurity: No Food Insecurity  . Worried About Programme researcher, broadcasting/film/video in the Last Year: Never true  . Ran Out of Food in the Last Year: Never true  Transportation Needs: No Transportation Needs  . Lack of Transportation (Medical): No  . Lack of Transportation (Non-Medical): No  Physical Activity: Insufficiently Active  . Days of Exercise per Week: 2 days  . Minutes of Exercise per Session: 30 min  Stress: No Stress Concern Present  . Feeling of Stress : Not at all  Social Connections: Socially Isolated  . Frequency of Communication with Friends and Family: More than three times a week  . Frequency of Social Gatherings with Friends and Family: Twice a week  . Attends Religious Services: Never  . Active Member of Clubs or Organizations: No  . Attends Banker Meetings: Never  . Marital Status: Never married    Tobacco Counseling Counseling given: Not Answered Comment: smoking cessation materials not required   Clinical Intake:                          Activities of Daily Living In your present state of health, do you have any difficulty performing the following activities: 07/06/2020  Hearing? N  Comment declines hearing aids  Vision? N  Difficulty concentrating or making decisions? N  Walking or climbing stairs? N  Dressing or bathing? N  Doing errands, shopping? N  Preparing Food and eating ? N  Using the Toilet? N  In the past six months, have you accidently leaked urine? N  Do you have problems with loss of bowel control? N  Managing your Medications? N  Managing your Finances? N  Housekeeping or managing your Housekeeping? N  Some recent data might be hidden     Patient Care Team: Alba Cory, MD as PCP - General (Family Medicine) Lonell Face, MD as Consulting Physician (Neurology)  Indicate any recent Medical Services you may have received from other than [facility] providers in the past year (date may be approximate).     Assessment:   This is a routine wellness examination for Raven Ray.  Hearing/Vision screen  Hearing Screening   125Hz  250Hz  500Hz  1000Hz  2000Hz  3000Hz  4000Hz  6000Hz  8000Hz   Right ear:           Left ear:           Comments: Pt denies hearing difficulty  Vision Screening Comments: [facility]  Dietary issues and exercise activities discussed: Current Exercise Habits: Home exercise routine, Type of exercise: yoga, Time (Minutes): 30, Frequency (Times/Week): 2, Weekly Exercise (Minutes/Week): 60, Intensity: Mild, Exercise limited by: orthopedic condition(s)  Goals    . DIET - INCREASE WATER INTAKE     Recommend to  drink at least 6-8 8oz glasses of water per day.    . Exercise 3x per week (30 min per time)     Recommend increasing exercise to 3 times a week for at least 30 minutes.       Depression Screen PHQ 2/9 Scores 07/06/2020 05/17/2020 06/26/2019 03/17/2019 06/27/2018 06/18/2018 12/14/2017  PHQ - 2 Score 0 0 0 0 0 0 0  PHQ- 9 Score - 0 - 0 0 0 -    Fall Risk Fall Risk  07/06/2020 05/17/2020 06/26/2019 03/17/2019 06/27/2018  Falls in the past year? 0 0 0 0 No  Number falls in past yr: 0 0 0 0 -  Injury with Fall? 0 0 0 0 -  Risk for fall due to : No Fall Risks - - - -  Risk for fall due to: Comment - - - - -  Follow up Falls prevention discussed - Falls prevention discussed - -    Any stairs in or around the home? No  If so, are there any without handrails? No  Home free of loose throw rugs in walkways, pet beds, electrical cords, etc? Yes  Adequate lighting in your home to reduce risk of falls? Yes   ASSISTIVE DEVICES UTILIZED TO PREVENT FALLS:  Life alert? No  Use of a  cane, walker or w/c? No  Grab bars in the bathroom? No  Shower chair or bench in shower? No  Elevated toilet seat or a handicapped toilet? No   TIMED UP AND GO:  Was the test performed? No . Telephonic visit.   Cognitive Function: 6CIT deferred for 2021 AWV; pt states no memory issues.      6CIT Screen 06/18/2018  What Year? 0 points  What month? 0 points  What time? 0 points  Count back from 20 0 points  Months in reverse 0 points  Repeat phrase 0 points  Total Score 0    Immunizations Immunization History  Administered Date(s) Administered  . Influenza,inj,Quad PF,6+ Mos 06/14/2017  . Tdap 06/10/2013    TDAP status: Up to date   Flu Vaccine status: Declined, Education has been provided regarding the importance of this vaccine but patient still declined. Advised may receive this vaccine at local pharmacy or Health Dept. Aware to provide a copy of the vaccination record if obtained from local pharmacy or Health Dept. Verbalized acceptance and understanding.   Pneumococcal vaccine status: due age 60  Covid-19 vaccine status: Declined, Education has been provided regarding the importance of this vaccine but patient still declined. Advised may receive this vaccine at local pharmacy or Health Dept.or vaccine clinic. Aware to provide a copy of the vaccination record if obtained from local pharmacy or Health Dept. Verbalized acceptance and understanding.  Qualifies for Shingles Vaccine? No  - due at age 50  Screening Tests Health Maintenance  Topic Date Due  . COVID-19 Vaccine (1) Never done  . INFLUENZA VACCINE  05/02/2020  . PAP SMEAR-Modifier  10/03/2020  . TETANUS/TDAP  06/11/2023  . Hepatitis C Screening  Completed  . HIV Screening  Completed    Health Maintenance  Health Maintenance Due  Topic Date Due  . COVID-19 Vaccine (1) Never done  . INFLUENZA VACCINE  05/02/2020   Colorectal cancer screening status: due age 75  Mammogram status: pt states she has not  had mammogram before but gets yearly physicals and breast exams at [facility]. Pt to contact [facility] to order mammo and if they are unable to do order she  will let us know for Dr. Carlynn Purl to order.   Bone density status screening: due age 12  Lung Cancer Screening: (Low Dose CT Chest recommended if Age 75-80 years, 30 pack-year currently smoking OR have quit w/in 15years.) does not qualify.   Additional Screening:  Hepatitis C Screening: does qualify; Completed 05/17/20.   Vision Screening: Recommended annual ophthalmology exams for early detection of glaucoma and other disorders of the eye. Is the patient up to date with their annual eye exam?  Yes  Who is the provider or what is the name of the office in which the patient attends annual eye exams? [facility]  Dental Screening: Recommended annual dental exams for proper oral hygiene  Community Resource Referral / Chronic Care Management: CRR required this visit?  No   CCM required this visit?  No      Plan:     I have personally reviewed and noted the following in the patient's chart:   . Medical and social history . Use of alcohol, tobacco or illicit drugs  . Current medications and supplements . Functional ability and status . Nutritional status . Physical activity . Advanced directives . List of other physicians . Hospitalizations, surgeries, and ER visits in previous 12 months . Vitals . Screenings to include cognitive, depression, and falls . Referrals and appointments  In addition, I have reviewed and discussed with patient certain preventive protocols, quality metrics, and best practice recommendations. A written personalized care plan for preventive services as well as general preventive health recommendations were provided to patient.     Raven Littler, LPN   [id number]   Nurse Notes: pt doing well and appreciative of visit today

## 2020-07-06 NOTE — Patient Instructions (Signed)
Raven Ray , Thank you for taking time to come for your Medicare Wellness Visit. I appreciate your ongoing commitment to your health goals. Please review the following plan we discussed and let me know if I can assist you in the future.   Screening recommendations/referrals: Colonoscopy: due age 40 Mammogram: due - please contact [facility] for GYN to order mammogram Bone Density: due age 63 Recommended yearly ophthalmology/optometry visit for glaucoma screening and checkup Recommended yearly dental visit for hygiene and checkup  Vaccinations: Influenza vaccine: due Pneumococcal vaccine: due age 6 Tdap vaccine: done 06/10/13 Shingles vaccine: due age 2  Covid-19:  declined  Advanced directives: Advance directive discussed with you today. I have provided a copy for you to complete at home and have notarized. Once this is complete please bring a copy in to our office so we can scan it into your chart.  Conditions/risks identified: Recommend physical activity 3 times per week  Next appointment: Follow up in one year for your annual wellness visit.   Preventive Care 40-64 Years, Female Preventive care refers to lifestyle choices and visits with your health care provider that can promote health and wellness. What does preventive care include?  A yearly physical exam. This is also called an annual well check.  Dental exams once or twice a year.  Routine eye exams. Ask your health care provider how often you should have your eyes checked.  Personal lifestyle choices, including:  Daily care of your teeth and gums.  Regular physical activity.  Eating a healthy diet.  Avoiding tobacco and drug use.  Limiting alcohol use.  Practicing safe sex.  Taking low-dose aspirin daily starting at age 37.  Taking vitamin and mineral supplements as recommended by your health care provider. What happens during an annual well check? The services and screenings done by your health care provider  during your annual well check will depend on your age, overall health, lifestyle risk factors, and family history of disease. Counseling  Your health care provider may ask you questions about your:  Alcohol use.  Tobacco use.  Drug use.  Emotional well-being.  Home and relationship well-being.  Sexual activity.  Eating habits.  Work and work Statistician.  Method of birth control.  Menstrual cycle.  Pregnancy history. Screening  You may have the following tests or measurements:  Height, weight, and BMI.  Blood pressure.  Lipid and cholesterol levels. These may be checked every 5 years, or more frequently if you are over 43 years old.  Skin check.  Lung cancer screening. You may have this screening every year starting at age 22 if you have a 30-pack-year history of smoking and currently smoke or have quit within the past 15 years.  Fecal occult blood test (FOBT) of the stool. You may have this test every year starting at age 9.  Flexible sigmoidoscopy or colonoscopy. You may have a sigmoidoscopy every 5 years or a colonoscopy every 10 years starting at age 12.  Hepatitis C blood test.  Hepatitis B blood test.  Sexually transmitted disease (STD) testing.  Diabetes screening. This is done by checking your blood sugar (glucose) after you have not eaten for a while (fasting). You may have this done every 1-3 years.  Mammogram. This may be done every 1-2 years. Talk to your health care provider about when you should start having regular mammograms. This may depend on whether you have a family history of breast cancer.  BRCA-related cancer screening. This may be done if you  have a family history of breast, ovarian, tubal, or peritoneal cancers.  Pelvic exam and Pap test. This may be done every 3 years starting at age 33. Starting at age 9, this may be done every 5 years if you have a Pap test in combination with an HPV test.  Bone density scan. This is done to screen  for osteoporosis. You may have this scan if you are at high risk for osteoporosis. Discuss your test results, treatment options, and if necessary, the need for more tests with your health care provider. Vaccines  Your health care provider may recommend certain vaccines, such as:  Influenza vaccine. This is recommended every year.  Tetanus, diphtheria, and acellular pertussis (Tdap, Td) vaccine. You may need a Td booster every 10 years.  Zoster vaccine. You may need this after age 54.  Pneumococcal 13-valent conjugate (PCV13) vaccine. You may need this if you have certain conditions and were not previously vaccinated.  Pneumococcal polysaccharide (PPSV23) vaccine. You may need one or two doses if you smoke cigarettes or if you have certain conditions. Talk to your health care provider about which screenings and vaccines you need and how often you need them. This information is not intended to replace advice given to you by your health care provider. Make sure you discuss any questions you have with your health care provider. Document Released: 10/15/2015 Document Revised: 06/07/2016 Document Reviewed: 07/20/2015 Elsevier Interactive Patient Education  2017 [place] Prevention in the Home Falls can cause injuries. They can happen to people of all ages. There are many things you can do to make your home safe and to help prevent falls. What can I do on the outside of my home?  Regularly fix the edges of walkways and driveways and fix any cracks.  Remove anything that might make you trip as you walk through a door, such as a raised step or threshold.  Trim any bushes or trees on the path to your home.  Use bright outdoor lighting.  Clear any walking paths of anything that might make someone trip, such as rocks or tools.  Regularly check to see if handrails are loose or broken. Make sure that both sides of any steps have handrails.  Any raised decks and porches should  have guardrails on the edges.  Have any leaves, snow, or ice cleared regularly.  Use sand or salt on walking paths during winter.  Clean up any spills in your garage right away. This includes oil or grease spills. What can I do in the bathroom?  Use night lights.  Install grab bars by the toilet and in the tub and shower. Do not use towel bars as grab bars.  Use non-skid mats or decals in the tub or shower.  If you need to sit down in the shower, use a plastic, non-slip stool.  Keep the floor dry. Clean up any water that spills on the floor as soon as it happens.  Remove soap buildup in the tub or shower regularly.  Attach bath mats securely with double-sided non-slip rug tape.  Do not have throw rugs and other things on the floor that can make you trip. What can I do in the bedroom?  Use night lights.  Make sure that you have a light by your bed that is easy to reach.  Do not use any sheets or blankets that are too big for your bed. They should not hang down onto the floor.  Have  a firm chair that has side arms. You can use this for support while you get dressed.  Do not have throw rugs and other things on the floor that can make you trip. What can I do in the kitchen?  Clean up any spills right away.  Avoid walking on wet floors.  Keep items that you use a lot in easy-to-reach places.  If you need to reach something above you, use a strong step stool that has a grab bar.  Keep electrical cords out of the way.  Do not use floor polish or wax that makes floors slippery. If you must use wax, use non-skid floor wax.  Do not have throw rugs and other things on the floor that can make you trip. What can I do with my stairs?  Do not leave any items on the stairs.  Make sure that there are handrails on both sides of the stairs and use them. Fix handrails that are broken or loose. Make sure that handrails are as long as the stairways.  Check any carpeting to make sure  that it is firmly attached to the stairs. Fix any carpet that is loose or worn.  Avoid having throw rugs at the top or bottom of the stairs. If you do have throw rugs, attach them to the floor with carpet tape.  Make sure that you have a light switch at the top of the stairs and the bottom of the stairs. If you do not have them, ask someone to add them for you. What else can I do to help prevent falls?  Wear shoes that:  Do not have high heels.  Have rubber bottoms.  Are comfortable and fit you well.  Are closed at the toe. Do not wear sandals.  If you use a stepladder:  Make sure that it is fully opened. Do not climb a closed stepladder.  Make sure that both sides of the stepladder are locked into place.  Ask someone to hold it for you, if possible.  Clearly mark and make sure that you can see:  Any grab bars or handrails.  First and last steps.  Where the edge of each step is.  Use tools that help you move around (mobility aids) if they are needed. These include:  Canes.  Walkers.  Scooters.  Crutches.  Turn on the lights when you go into a dark area. Replace any light bulbs as soon as they burn out.  Set up your furniture so you have a clear path. Avoid moving your furniture around.  If any of your floors are uneven, fix them.  If there are any pets around you, be aware of where they are.  Review your medicines with your doctor. Some medicines can make you feel dizzy. This can increase your chance of falling. Ask your doctor what other things that you can do to help prevent falls. This information is not intended to replace advice given to you by your health care provider. Make sure you discuss any questions you have with your health care provider. Document Released: 07/15/2009 Document Revised: 02/24/2016 Document Reviewed: 10/23/2014 Elsevier Interactive Patient Education  2017 [employer].

## 2020-07-09 DIAGNOSIS — M25562 Pain in left knee: Secondary | ICD-10-CM | POA: Diagnosis not present

## 2020-07-09 DIAGNOSIS — M25561 Pain in right knee: Secondary | ICD-10-CM | POA: Diagnosis not present

## 2020-07-12 DIAGNOSIS — M25561 Pain in right knee: Secondary | ICD-10-CM | POA: Diagnosis not present

## 2020-07-12 DIAGNOSIS — M25562 Pain in left knee: Secondary | ICD-10-CM | POA: Diagnosis not present

## 2020-07-16 DIAGNOSIS — M25562 Pain in left knee: Secondary | ICD-10-CM | POA: Diagnosis not present

## 2020-07-16 DIAGNOSIS — M25561 Pain in right knee: Secondary | ICD-10-CM | POA: Diagnosis not present

## 2020-07-19 DIAGNOSIS — M25561 Pain in right knee: Secondary | ICD-10-CM | POA: Diagnosis not present

## 2020-07-19 DIAGNOSIS — M25562 Pain in left knee: Secondary | ICD-10-CM | POA: Diagnosis not present

## 2020-07-23 DIAGNOSIS — M25561 Pain in right knee: Secondary | ICD-10-CM | POA: Diagnosis not present

## 2020-07-23 DIAGNOSIS — M25562 Pain in left knee: Secondary | ICD-10-CM | POA: Diagnosis not present

## 2020-10-02 ENCOUNTER — Other Ambulatory Visit: Payer: Self-pay | Admitting: Physician Assistant

## 2020-10-02 DIAGNOSIS — Z3009 Encounter for other general counseling and advice on contraception: Secondary | ICD-10-CM

## 2020-10-02 DIAGNOSIS — Z3049 Encounter for surveillance of other contraceptives: Secondary | ICD-10-CM

## 2020-10-04 NOTE — Telephone Encounter (Signed)
Per chart review, patient due for PE with CBE and pap after 10/09/2020.  Will OK one refill for patient until she can get her RP.

## 2020-10-30 ENCOUNTER — Other Ambulatory Visit: Payer: Self-pay | Admitting: Family Medicine

## 2020-10-30 DIAGNOSIS — E559 Vitamin D deficiency, unspecified: Secondary | ICD-10-CM

## 2020-12-06 ENCOUNTER — Ambulatory Visit: Payer: Medicare Other

## 2020-12-06 ENCOUNTER — Telehealth: Payer: Self-pay | Admitting: Student

## 2020-12-06 NOTE — Telephone Encounter (Signed)
LM to reschedule apt. No provider available this afternoon. Sharlyne Pacas, RN

## 2020-12-13 ENCOUNTER — Ambulatory Visit: Payer: Medicare Other

## 2020-12-20 ENCOUNTER — Encounter: Payer: Self-pay | Admitting: Physician Assistant

## 2020-12-20 ENCOUNTER — Ambulatory Visit (LOCAL_COMMUNITY_HEALTH_CENTER): Payer: Medicare Other | Admitting: Physician Assistant

## 2020-12-20 ENCOUNTER — Other Ambulatory Visit: Payer: Self-pay

## 2020-12-20 VITALS — BP 118/71 | Ht 62.0 in | Wt 216.4 lb

## 2020-12-20 DIAGNOSIS — Z3049 Encounter for surveillance of other contraceptives: Secondary | ICD-10-CM

## 2020-12-20 DIAGNOSIS — Z113 Encounter for screening for infections with a predominantly sexual mode of transmission: Secondary | ICD-10-CM

## 2020-12-20 DIAGNOSIS — Z3009 Encounter for other general counseling and advice on contraception: Secondary | ICD-10-CM

## 2020-12-20 DIAGNOSIS — Z01419 Encounter for gynecological examination (general) (routine) without abnormal findings: Secondary | ICD-10-CM

## 2020-12-20 LAB — WET PREP FOR TRICH, YEAST, CLUE
Trichomonas Exam: NEGATIVE
Yeast Exam: NEGATIVE

## 2020-12-20 LAB — HM HIV SCREENING LAB: HM HIV Screening: NEGATIVE

## 2020-12-20 LAB — HM HEPATITIS C SCREENING LAB: HM Hepatitis Screen: NEGATIVE

## 2020-12-20 MED ORDER — ETONOGESTREL-ETHINYL ESTRADIOL 0.12-0.015 MG/24HR VA RING: VAGINAL_RING | VAGINAL | 4 refills | Status: DC

## 2020-12-20 NOTE — Progress Notes (Signed)
Wet mount reviewed, no treatment indicated..Markevion Lattin Brewer-Jensen, RN 

## 2020-12-20 NOTE — Progress Notes (Signed)
Patient here for PE and STI testing. Uses NuvaRing for BCM. Marland KitchenBurt Knack, RN
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
## 2020-12-31 ENCOUNTER — Other Ambulatory Visit: Payer: Self-pay | Admitting: Physician Assistant

## 2020-12-31 DIAGNOSIS — Z3009 Encounter for other general counseling and advice on contraception: Secondary | ICD-10-CM

## 2020-12-31 DIAGNOSIS — Z3049 Encounter for surveillance of other contraceptives: Secondary | ICD-10-CM

## 2020-12-31 NOTE — Telephone Encounter (Signed)
On 12/20/2020, Rx sent to pharmacy for patient with refills for year at her annual visit.

## 2021-01-13 DIAGNOSIS — H5010 Unspecified exotropia: Secondary | ICD-10-CM | POA: Diagnosis not present

## 2021-03-29 DIAGNOSIS — M25561 Pain in right knee: Secondary | ICD-10-CM | POA: Diagnosis not present

## 2021-03-29 DIAGNOSIS — M25562 Pain in left knee: Secondary | ICD-10-CM | POA: Diagnosis not present

## 2021-07-07 ENCOUNTER — Ambulatory Visit: Payer: Medicare Other

## 2021-07-11 ENCOUNTER — Ambulatory Visit: Payer: Medicare Other

## 2021-07-15 ENCOUNTER — Other Ambulatory Visit: Payer: Self-pay

## 2021-07-15 ENCOUNTER — Ambulatory Visit: Payer: Self-pay | Admitting: Physician Assistant

## 2021-07-15 DIAGNOSIS — Z113 Encounter for screening for infections with a predominantly sexual mode of transmission: Secondary | ICD-10-CM

## 2021-07-15 LAB — WET PREP FOR TRICH, YEAST, CLUE: Trichomonas Exam: NEGATIVE

## 2021-07-15 NOTE — Progress Notes (Signed)
Pt here for STD screening.  Wet mount results reviewed, no treatment required per SO.  Pt declined condoms.  Skipper Dacosta M Johnanthony Wilden, RN ° °

## 2021-07-16 ENCOUNTER — Encounter: Payer: Self-pay | Admitting: Physician Assistant
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
## 2021-07-21 LAB — GONOCOCCUS CULTURE
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
## 2021-08-08 ENCOUNTER — Ambulatory Visit (INDEPENDENT_AMBULATORY_CARE_PROVIDER_SITE_OTHER): Payer: Medicare Other | Admitting: Family Medicine

## 2021-08-08 ENCOUNTER — Encounter: Payer: Self-pay | Admitting: Family Medicine

## 2021-08-08 ENCOUNTER — Other Ambulatory Visit: Payer: Self-pay

## 2021-08-08 VITALS — BP 120/80 | HR 86 | Temp 98.2°F | Resp 16 | Ht 62.0 in | Wt 211.0 lb

## 2021-08-08 DIAGNOSIS — G7000 Myasthenia gravis without (acute) exacerbation: Secondary | ICD-10-CM

## 2021-08-08 DIAGNOSIS — E559 Vitamin D deficiency, unspecified: Secondary | ICD-10-CM

## 2021-08-08 DIAGNOSIS — R739 Hyperglycemia, unspecified: Secondary | ICD-10-CM | POA: Diagnosis not present

## 2021-08-08 MED ORDER — VITAMIN D (ERGOCALCIFEROL) 1.25 MG (50000 UNIT) PO CAPS: 50000.0000 [IU] | ORAL_CAPSULE | ORAL | 0 refills | Status: DC

## 2021-08-16 ENCOUNTER — Ambulatory Visit (INDEPENDENT_AMBULATORY_CARE_PROVIDER_SITE_OTHER): Payer: Medicare Other

## 2021-08-16 DIAGNOSIS — Z0000 Encounter for general adult medical examination without abnormal findings: Secondary | ICD-10-CM | POA: Diagnosis not present

## 2021-08-16 DIAGNOSIS — Z1231 Encounter for screening mammogram for malignant neoplasm of breast: Secondary | ICD-10-CM

## 2021-08-16 NOTE — Progress Notes (Signed)
Subjective:   Raven Ray is a 40 y.o. female who presents for Medicare Annual (Subsequent) preventive examination.  Virtual Visit via Telephone Note  I connected with  Raven Ray on 08/16/21 at 11:20 AM EST by telephone and verified that I am speaking with the correct person using two identifiers.  Location: Patient: home Provider: [facility] Persons participating in the virtual visit: patient/Nurse Health Advisor   I discussed the limitations, risks, security and privacy concerns of performing an evaluation and management service by telephone and the availability of in person appointments. The patient expressed understanding and agreed to proceed.  Interactive audio and video telecommunications were attempted between this nurse and patient, however failed, due to patient having technical difficulties OR patient did not have access to video capability.  We continued and completed visit with audio only.  Some vital signs may be absent or patient reported.   Raven Littler, LPN   Review of Systems     Cardiac Risk Factors include: obesity (BMI >30kg/m2)     Objective:    There were no vitals filed for this visit. There is no height or weight on file to calculate BMI.  Advanced Directives 08/16/2021 07/06/2020 06/26/2019 06/18/2018 06/14/2017 06/11/2017 09/06/2016  Does Patient Have a Medical Advance Directive? No No No No No No No  Would patient like information on creating a medical advance directive? Yes (MAU/Ambulatory/Procedural Areas - Information given) Yes (MAU/Ambulatory/Procedural Areas - Information given) Yes (MAU/Ambulatory/Procedural Areas - Information given) Yes (MAU/Ambulatory/Procedural Areas - Information given) - - -    Current Medications (verified) Outpatient Encounter Medications as of 08/16/2021  Medication Sig   etonogestrel-ethinyl estradiol (NUVARING) 0.12-0.015 MG/24HR vaginal ring INSERT 1 RING VAGINALLY AS DIRECTED. REMOVE AFTER 3 WEEKS & WAIT 7 DAYS  BEFORE INSERTING A NEW RING   omeprazole (PRILOSEC) 20 MG capsule Take by mouth daily. PRN only   Vitamin D, Ergocalciferol, (DRISDOL) 1.25 MG (50000 UNIT) CAPS capsule Take 1 capsule (50,000 Units total) by mouth every 7 (seven) days. After that 2000 units daily   pyridostigmine (MESTINON) 60 MG tablet Take by mouth.   No facility-administered encounter medications on file as of 08/16/2021.    Allergies (verified) Patient has no known allergies.   History: Past Medical History:  Diagnosis Date   GERD (gastroesophageal reflux disease) 03/08/2018   Myasthenia gravis [facility])    Past Surgical History:  Procedure Laterality Date   CESAREAN SECTION  10/10/1999   TOTAL THYMECTOMY  1991   Family History  Problem Relation Age of Onset   Hypertension Mother    Migraines Mother    Hypertension Father    Diabetes Maternal Grandmother    Social History   Socioeconomic History   Marital status: Single    Spouse name: Not on file   Number of children: 2   Years of education: some college   Highest education level: Associate degree: occupational, Scientist, product/process development, or vocational program  Occupational History   Occupation: Disabled  Tobacco Use   Smoking status: Former    Types: Cigarettes   Smokeless tobacco: Never   Tobacco comments:    smoking cessation materials not required  Vaping Use   Vaping Use: Never used  Substance and Sexual Activity   Alcohol use: No   Drug use: No   Sexual activity: Not Currently    Birth control/protection: Other-see comments    Comment: Nuva Ring  Other Topics Concern   Not on file  Social History Narrative   She is on  disability but she states she went back to work part time to not feel disabled. 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       She completed school to become a para-legal       Social Determinants of Corporate investment banker Strain: Low Risk    Difficulty of Paying Living Expenses: Not hard at all  Food Insecurity: No Food  Insecurity   Worried About Programme researcher, broadcasting/film/video in the Last Year: Never true   Barista in the Last Year: Never true  Transportation Needs: No Transportation Needs   Lack of Transportation (Medical): No   Lack of Transportation (Non-Medical): No  Physical Activity: Insufficiently Active   Days of Exercise per Week: 2 days   Minutes of Exercise per Session: 30 min  Stress: No Stress Concern Present   Feeling of Stress : Not at all  Social Connections: Socially Isolated   Frequency of Communication with Friends and Family: More than three times a week   Frequency of Social Gatherings with Friends and Family: Twice a week   Attends Religious Services: Never   Database administrator or Organizations: No   Attends Engineer, structural: Never   Marital Status: Never married    Tobacco Counseling Counseling given: Not Answered Tobacco comments: smoking cessation materials not required   Clinical Intake:  Pre-visit preparation completed: Yes  Pain : No/denies pain     Nutritional Risks: None Diabetes: No  How often do you need to have someone help you when you read instructions, pamphlets, or other written materials from your doctor or pharmacy?: 1 - Never    Interpreter Needed?: No  Information entered by :: Raven Littler LPN   Activities of Daily Living In your present state of health, do you have any difficulty performing the following activities: 08/16/2021 08/08/2021  Hearing? N N  Vision? N N  Difficulty concentrating or making decisions? N N  Walking or climbing stairs? N N  Dressing or bathing? N N  Doing errands, shopping? N N  Preparing Food and eating ? N -  Using the Toilet? N -  In the past six months, have you accidently leaked urine? N -  Do you have problems with loss of bowel control? N -  Managing your Medications? N -  Managing your Finances? N -  Housekeeping or managing your Housekeeping? N -  Some recent data might be hidden     Patient Care Team: Alba Cory, MD as PCP - General (Family Medicine) Lonell Face, MD as Consulting Physician (Neurology)  Indicate any recent Medical Services you may have received from other than [facility] providers in the past year (date may be approximate).     Assessment:   This is a routine wellness examination for Iliyana.  Hearing/Vision screen Hearing Screening - Comments:: Pt denies hearing difficulty Vision Screening - Comments:: Annual vision screenings at [facility]  Dietary issues and exercise activities discussed: Current Exercise Habits: Home exercise routine, Type of exercise: walking, Time (Minutes): 30, Frequency (Times/Week): 2, Weekly Exercise (Minutes/Week): 60, Intensity: Mild, Exercise limited by: None identified   Goals Addressed             This Visit's Progress    DIET - INCREASE WATER INTAKE   On track    Recommend to drink at least 6-8 8oz glasses of water per day.     Exercise 3x per week (30 min per time)  On track    Recommend increasing exercise to 3 times a week for at least 30 minutes.        Depression Screen PHQ 2/9 Scores 08/16/2021 08/08/2021 12/20/2020 07/06/2020 05/17/2020 06/26/2019 03/17/2019  PHQ - 2 Score 0 0 0 0 0 0 0  PHQ- 9 Score - 0 - - 0 - 0    Fall Risk Fall Risk  08/16/2021 08/08/2021 07/06/2020 05/17/2020 06/26/2019  Falls in the past year? 0 0 0 0 0  Number falls in past yr: 0 0 0 0 0  Injury with Fall? 0 0 0 0 0  Risk for fall due to : No Fall Risks No Fall Risks No Fall Risks - -  Risk for fall due to: Comment - - - - -  Follow up Falls prevention discussed Falls prevention discussed Falls prevention discussed - Falls prevention discussed    FALL RISK PREVENTION PERTAINING TO THE HOME:  Any stairs in or around the home? No  If so, are there any without handrails? No  Home free of loose throw rugs in walkways, pet beds, electrical cords, etc? Yes  Adequate lighting in your home to reduce risk of falls?  Yes   ASSISTIVE DEVICES UTILIZED TO PREVENT FALLS:  Life alert? No  Use of a cane, walker or w/c? No  Grab bars in the bathroom? No  Shower chair or bench in shower? No  Elevated toilet seat or a handicapped toilet? No   TIMED UP AND GO:  Was the test performed? No . Telephonic visit.   Cognitive Function: Normal cognitive status assessed by direct observation by this Nurse Health Advisor. No abnormalities found.       6CIT Screen 06/18/2018  What Year? 0 points  What month? 0 points  What time? 0 points  Count back from 20 0 points  Months in reverse 0 points  Repeat phrase 0 points  Total Score 0    Immunizations Immunization History  Administered Date(s) Administered   Influenza,inj,Quad PF,6+ Mos 06/14/2017   Tdap 06/10/2013    TDAP status: Up to date  Flu Vaccine status: Declined, Education has been provided regarding the importance of this vaccine but patient still declined. Advised may receive this vaccine at local pharmacy or Health Dept. Aware to provide a copy of the vaccination record if obtained from local pharmacy or Health Dept. Verbalized acceptance and understanding.  Pneumococcal vaccine status: Due, Education has been provided regarding the importance of this vaccine. Advised may receive this vaccine at local pharmacy or Health Dept. Aware to provide a copy of the vaccination record if obtained from local pharmacy or Health Dept. Verbalized acceptance and understanding.  Covid-19 vaccine status: Declined, Education has been provided regarding the importance of this vaccine but patient still declined. Advised may receive this vaccine at local pharmacy or Health Dept.or vaccine clinic. Aware to provide a copy of the vaccination record if obtained from local pharmacy or Health Dept. Verbalized acceptance and understanding.  Qualifies for Shingles Vaccine? No   due at age 37  Screening Tests Health Maintenance  Topic Date Due   MAMMOGRAM  Never done    COVID-19 Vaccine (1) 08/21/2021 (Originally 12/12/1981)   INFLUENZA VACCINE  12/30/2021 (Originally 05/02/2021)   PAP SMEAR-Modifier  10/03/2022   TETANUS/TDAP  06/11/2023   Hepatitis C Screening  Completed   HIV Screening  Completed   Pneumococcal Vaccine 55-37 Years old  Aged Out   HPV VACCINES  Aged Out    Health Maintenance  Health Maintenance Due  Topic Date Due   MAMMOGRAM  Never done    Colorectal cancer screening: due age 38  Mammogram status: Ordered today. Pt provided with contact info and advised to call to schedule appt.   Bone density status: due age 68  Lung Cancer Screening: (Low Dose CT Chest recommended if Age 24-80 years, 30 pack-year currently smoking OR have quit w/in 15years.) does not qualify.   Additional Screening:  Hepatitis C Screening: does qualify; Completed 12/20/20  Vision Screening: Recommended annual ophthalmology exams for early detection of glaucoma and other disorders of the eye. Is the patient up to date with their annual eye exam?  Yes  Who is the provider or what is the name of the office in which the patient attends annual eye exams? [facility].   Dental Screening: Recommended annual dental exams for proper oral hygiene  Community Resource Referral / Chronic Care Management: CRR required this visit?  No   CCM required this visit?  No      Plan:     I have personally reviewed and noted the following in the patient's chart:   Medical and social history Use of alcohol, tobacco or illicit drugs  Current medications and supplements including opioid prescriptions.  Functional ability and status Nutritional status Physical activity Advanced directives List of other physicians Hospitalizations, surgeries, and ER visits in previous 12 months Vitals Screenings to include cognitive, depression, and falls Referrals and appointments  In addition, I have reviewed and discussed with patient certain preventive protocols, quality  metrics, and best practice recommendations. A written personalized care plan for preventive services as well as general preventive health recommendations were provided to patient.     Raven Littler, LPN   [id number]   Nurse Notes: none

## 2021-08-16 NOTE — Patient Instructions (Signed)
Raven Ray , Thank you for taking time to come for your Medicare Wellness Visit. I appreciate your ongoing commitment to your health goals. Please review the following plan we discussed and let me know if I can assist you in the future.   Screening recommendations/referrals: Colonoscopy: due age 40 Mammogram: Please call [phone] to schedule your mammogram.  Bone Density: due age 46 Recommended yearly ophthalmology/optometry visit for glaucoma screening and checkup Recommended yearly dental visit for hygiene and checkup  Vaccinations: Influenza vaccine: declined Pneumococcal vaccine: due for Prevnar20 Tdap vaccine: done 06/10/13 Shingles vaccine: due age 70  Covid-19: declined  Advanced directives: Advance directive discussed with you today. I have provided a copy for you to complete at home and have notarized. Once this is complete please bring a copy in to our office so we can scan it into your chart.   Conditions/risks identified: Recommend increasing physical activity   Next appointment: Follow up in one year for your annual wellness visit.   Preventive Care 40-64 Years, Female Preventive care refers to lifestyle choices and visits with your health care provider that can promote health and wellness. What does preventive care include? A yearly physical exam. This is also called an annual well check. Dental exams once or twice a year. Routine eye exams. Ask your health care provider how often you should have your eyes checked. Personal lifestyle choices, including: Daily care of your teeth and gums. Regular physical activity. Eating a healthy diet. Avoiding tobacco and drug use. Limiting alcohol use. Practicing safe sex. Taking low-dose aspirin daily starting at age 52. Taking vitamin and mineral supplements as recommended by your health care provider. What happens during an annual well check? The services and screenings done by your health care provider during your annual  well check will depend on your age, overall health, lifestyle risk factors, and family history of disease. Counseling  Your health care provider may ask you questions about your: Alcohol use. Tobacco use. Drug use. Emotional well-being. Home and relationship well-being. Sexual activity. Eating habits. Work and work Statistician. Method of birth control. Menstrual cycle. Pregnancy history. Screening  You may have the following tests or measurements: Height, weight, and BMI. Blood pressure. Lipid and cholesterol levels. These may be checked every 5 years, or more frequently if you are over 54 years old. Skin check. Lung cancer screening. You may have this screening every year starting at age 26 if you have a 30-pack-year history of smoking and currently smoke or have quit within the past 15 years. Fecal occult blood test (FOBT) of the stool. You may have this test every year starting at age 2. Flexible sigmoidoscopy or colonoscopy. You may have a sigmoidoscopy every 5 years or a colonoscopy every 10 years starting at age 65. Hepatitis C blood test. Hepatitis B blood test. Sexually transmitted disease (STD) testing. Diabetes screening. This is done by checking your blood sugar (glucose) after you have not eaten for a while (fasting). You may have this done every 1-3 years. Mammogram. This may be done every 1-2 years. Talk to your health care provider about when you should start having regular mammograms. This may depend on whether you have a family history of breast cancer. BRCA-related cancer screening. This may be done if you have a family history of breast, ovarian, tubal, or peritoneal cancers. Pelvic exam and Pap test. This may be done every 3 years starting at age 69. Starting at age 88, this may be done every 5 years if you  have a Pap test in combination with an HPV test. Bone density scan. This is done to screen for osteoporosis. You may have this scan if you are at high risk for  osteoporosis. Discuss your test results, treatment options, and if necessary, the need for more tests with your health care provider. Vaccines  Your health care provider may recommend certain vaccines, such as: Influenza vaccine. This is recommended every year. Tetanus, diphtheria, and acellular pertussis (Tdap, Td) vaccine. You may need a Td booster every 10 years. Zoster vaccine. You may need this after age 38. Pneumococcal 13-valent conjugate (PCV13) vaccine. You may need this if you have certain conditions and were not previously vaccinated. Pneumococcal polysaccharide (PPSV23) vaccine. You may need one or two doses if you smoke cigarettes or if you have certain conditions. Talk to your health care provider about which screenings and vaccines you need and how often you need them. This information is not intended to replace advice given to you by your health care provider. Make sure you discuss any questions you have with your health care provider. Document Released: 10/15/2015 Document Revised: 06/07/2016 Document Reviewed: 07/20/2015 Elsevier Interactive Patient Education  2017 [place] Prevention in the Home Falls can cause injuries. They can happen to people of all ages. There are many things you can do to make your home safe and to help prevent falls. What can I do on the outside of my home? Regularly fix the edges of walkways and driveways and fix any cracks. Remove anything that might make you trip as you walk through a door, such as a raised step or threshold. Trim any bushes or trees on the path to your home. Use bright outdoor lighting. Clear any walking paths of anything that might make someone trip, such as rocks or tools. Regularly check to see if handrails are loose or broken. Make sure that both sides of any steps have handrails. Any raised decks and porches should have guardrails on the edges. Have any leaves, snow, or ice cleared regularly. Use sand or  salt on walking paths during winter. Clean up any spills in your garage right away. This includes oil or grease spills. What can I do in the bathroom? Use night lights. Install grab bars by the toilet and in the tub and shower. Do not use towel bars as grab bars. Use non-skid mats or decals in the tub or shower. If you need to sit down in the shower, use a plastic, non-slip stool. Keep the floor dry. Clean up any water that spills on the floor as soon as it happens. Remove soap buildup in the tub or shower regularly. Attach bath mats securely with double-sided non-slip rug tape. Do not have throw rugs and other things on the floor that can make you trip. What can I do in the bedroom? Use night lights. Make sure that you have a light by your bed that is easy to reach. Do not use any sheets or blankets that are too big for your bed. They should not hang down onto the floor. Have a firm chair that has side arms. You can use this for support while you get dressed. Do not have throw rugs and other things on the floor that can make you trip. What can I do in the kitchen? Clean up any spills right away. Avoid walking on wet floors. Keep items that you use a lot in easy-to-reach places. If you need to reach something above you,  use a strong step stool that has a grab bar. Keep electrical cords out of the way. Do not use floor polish or wax that makes floors slippery. If you must use wax, use non-skid floor wax. Do not have throw rugs and other things on the floor that can make you trip. What can I do with my stairs? Do not leave any items on the stairs. Make sure that there are handrails on both sides of the stairs and use them. Fix handrails that are broken or loose. Make sure that handrails are as long as the stairways. Check any carpeting to make sure that it is firmly attached to the stairs. Fix any carpet that is loose or worn. Avoid having throw rugs at the top or bottom of the stairs. If  you do have throw rugs, attach them to the floor with carpet tape. Make sure that you have a light switch at the top of the stairs and the bottom of the stairs. If you do not have them, ask someone to add them for you. What else can I do to help prevent falls? Wear shoes that: Do not have high heels. Have rubber bottoms. Are comfortable and fit you well. Are closed at the toe. Do not wear sandals. If you use a stepladder: Make sure that it is fully opened. Do not climb a closed stepladder. Make sure that both sides of the stepladder are locked into place. Ask someone to hold it for you, if possible. Clearly mark and make sure that you can see: Any grab bars or handrails. First and last steps. Where the edge of each step is. Use tools that help you move around (mobility aids) if they are needed. These include: Canes. Walkers. Scooters. Crutches. Turn on the lights when you go into a dark area. Replace any light bulbs as soon as they burn out. Set up your furniture so you have a clear path. Avoid moving your furniture around. If any of your floors are uneven, fix them. If there are any pets around you, be aware of where they are. Review your medicines with your doctor. Some medicines can make you feel dizzy. This can increase your chance of falling. Ask your doctor what other things that you can do to help prevent falls. This information is not intended to replace advice given to you by your health care provider. Make sure you discuss any questions you have with your health care provider. Document Released: 07/15/2009 Document Revised: 02/24/2016 Document Reviewed: 10/23/2014 Elsevier Interactive Patient Education  2017 [employer].

## 2021-08-23 ENCOUNTER — Ambulatory Visit: Payer: Self-pay | Admitting: Physician Assistant

## 2021-08-23 ENCOUNTER — Other Ambulatory Visit: Payer: Self-pay

## 2021-08-23 DIAGNOSIS — Z113 Encounter for screening for infections with a predominantly sexual mode of transmission: Secondary | ICD-10-CM

## 2021-08-23 DIAGNOSIS — L0292 Furuncle, unspecified: Secondary | ICD-10-CM

## 2021-08-24 MED ORDER — CEPHALEXIN 500 MG PO CAPS: 500.0000 mg | ORAL_CAPSULE | Freq: Two times a day (BID) | ORAL | 0 refills | Status: AC

## 2021-08-24 NOTE — Progress Notes (Signed)
S:  Patient into clinic requesting a check on a "bump" in the genital area.  Reports that the bump comes and goes and is currently very painful.  Declines STD screening since was recently tested and has not had any sexual contact since the last test.  Reports that she has tried warm soaks to help and this does relieve some discomfort but the "bump" remains.  Patient requests that I "pop" the bump.  NKDA.   O:  WDWN female in NAD, A&O x 3, normal work of breathing, abdomen=soft, nt , no masses or guarding; external genitalia=normal female without nits, lice, edema, erythema or lesions; perineal area on left side partially on buttock with slightly raised, slightly erythematous, furuncle, tender to touch and without exudate. A/P:  1.  Furuncle in genital area. 2.  Counseled patient that the furuncle is deep and that I am not able to I&D it today. 3.  Enc to continue with warm soaks/compresses to area. 4.  Counseled patient not to squeeze or pick at area as that can lead to further inflammation which can lead to repeated lesions in the same area. 5.  Patient insists on a medicine to help clear furuncle. 6.  Rx for Keflex 500 mg #14 1 po BID for 7 days, no refills written and given to patient to take to help clear furuncle. 7.  Enc patient to follow up with PCP if continues to have issues with furuncle.

## 2021-10-28 ENCOUNTER — Other Ambulatory Visit: Payer: Self-pay | Admitting: Family Medicine

## 2021-10-28 DIAGNOSIS — E559 Vitamin D deficiency, unspecified: Secondary | ICD-10-CM
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
## 2021-11-09 ENCOUNTER — Other Ambulatory Visit: Payer: Self-pay

## 2021-11-09 ENCOUNTER — Encounter: Payer: Self-pay | Admitting: Family Medicine

## 2021-11-09 ENCOUNTER — Ambulatory Visit (INDEPENDENT_AMBULATORY_CARE_PROVIDER_SITE_OTHER): Payer: Medicare Other | Admitting: Family Medicine

## 2021-11-09 VITALS — BP 128/82 | HR 90 | Resp 16 | Ht 63.0 in | Wt 206.0 lb

## 2021-11-09 DIAGNOSIS — L729 Follicular cyst of the skin and subcutaneous tissue, unspecified: Secondary | ICD-10-CM

## 2021-11-10 ENCOUNTER — Other Ambulatory Visit: Payer: Self-pay

## 2021-11-10 ENCOUNTER — Ambulatory Visit (INDEPENDENT_AMBULATORY_CARE_PROVIDER_SITE_OTHER): Payer: Medicare Other | Admitting: Surgery

## 2021-11-10 ENCOUNTER — Encounter: Payer: Self-pay | Admitting: Surgery

## 2021-11-10 VITALS — BP 122/79 | HR 74 | Temp 99.1°F | Ht 63.0 in | Wt 205.2 lb

## 2021-11-10 DIAGNOSIS — K6289 Other specified diseases of anus and rectum: Secondary | ICD-10-CM | POA: Diagnosis not present

## 2021-11-10 DIAGNOSIS — L729 Follicular cyst of the skin and subcutaneous tissue, unspecified: Secondary | ICD-10-CM | POA: Insufficient documentation
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
## 2021-11-10 NOTE — Patient Instructions (Signed)
Please see your appointment listed below. 

## 2021-11-22 ENCOUNTER — Ambulatory Visit (INDEPENDENT_AMBULATORY_CARE_PROVIDER_SITE_OTHER): Payer: Medicare Other | Admitting: Surgery

## 2021-11-22 ENCOUNTER — Other Ambulatory Visit: Payer: Self-pay

## 2021-11-22 ENCOUNTER — Encounter: Payer: Self-pay | Admitting: Surgery

## 2021-11-22 VITALS — BP 123/78 | HR 79 | Temp 99.2°F | Ht 63.0 in | Wt 205.0 lb

## 2021-11-22 DIAGNOSIS — L728 Other follicular cysts of the skin and subcutaneous tissue: Secondary | ICD-10-CM | POA: Diagnosis not present

## 2021-11-22 DIAGNOSIS — L729 Follicular cyst of the skin and subcutaneous tissue, unspecified: Secondary | ICD-10-CM
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
## 2021-11-22 NOTE — Patient Instructions (Signed)
Wash with saop and water and rinse well. Keep a dressing over the area until it heals.

## 2021-12-01 ENCOUNTER — Encounter: Payer: Self-pay | Admitting: Surgery

## 2021-12-01 ENCOUNTER — Ambulatory Visit (INDEPENDENT_AMBULATORY_CARE_PROVIDER_SITE_OTHER): Payer: Medicare Other | Admitting: Surgery

## 2021-12-01 ENCOUNTER — Other Ambulatory Visit: Payer: Self-pay

## 2021-12-01 VITALS — BP 144/85 | HR 106 | Temp 98.6°F | Ht 63.0 in | Wt 210.0 lb

## 2021-12-01 DIAGNOSIS — Z09 Encounter for follow-up examination after completed treatment for conditions other than malignant neoplasm: Secondary | ICD-10-CM

## 2021-12-01 DIAGNOSIS — L729 Follicular cyst of the skin and subcutaneous tissue, unspecified: Secondary | ICD-10-CM

## 2021-12-01 NOTE — Progress Notes (Signed)
Returns today to follow-up her recurring cystic area in the perianal region on her left medial buttock.  From the area excised she has 85% epidermal coverage with just a small punctum of granulation.  There is no evidence of drainage, no evidence of erythema or induration.  This granulation looks clean and I expected cover with epidermis readily.  We will be glad to see her back as needed. ?

## 2021-12-01 NOTE — Patient Instructions (Signed)
If you have any concerns or questions, please feel free to call our office. Follow up as needed.  ? ?Incision and Drainage, Care After ?This sheet gives you information about how to care for yourself after your procedure. Your health care provider may also give you more specific instructions. If you have problems or questions, contact your health care provider. ?What can I expect after the procedure? ?After the procedure, it is common to have: ?Pain or discomfort around the incision site. ?Blood, fluid, or pus (drainage) from the incision. ?Redness and firm skin around the incision site. ?Follow these instructions at home: ?Medicines ?Take over-the-counter and prescription medicines only as told by your health care provider. ?If you were prescribed an antibiotic medicine, use or take it as told by your health care provider. Do not stop using the antibiotic even if you start to feel better. ?Wound care ?Follow instructions from your health care provider about how to take care of your wound. Make sure you: ?Wash your hands with soap and water before and after you change your bandage (dressing). If soap and water are not available, use hand sanitizer. ?Change your dressing and packing as told by your health care provider. ?If your dressing is dry or stuck when you try to remove it, moisten or wet the dressing with saline or water so that it can be removed without harming your skin or tissues. ?If your wound is packed, leave it in place until your health care provider tells you to remove it. To remove the packing, moisten or wet the packing with saline or water so that it can be removed without harming your skin or tissues. ?Leave stitches (sutures), skin glue, or adhesive strips in place. These skin closures may need to stay in place for 2 weeks or longer. If adhesive strip edges start to loosen and curl up, you may trim the loose edges. Do not remove adhesive strips completely unless your health care provider tells  you to do that. ?Check your wound every day for signs of infection. Check for: ?More redness, swelling, or pain. ?More fluid or blood. ?Warmth. ?Pus or a bad smell. ?If you were sent home with a drain tube in place, follow instructions from your health care provider about: ?How to empty it. ?How to care for it at home. ? ?General instructions ?Rest the affected area. ?Do not take baths, swim, or use a hot tub until your health care provider approves. Ask your health care provider if you may take showers. You may only be allowed to take sponge baths. ?Return to your normal activities as told by your health care provider. Ask your health care provider what activities are safe for you. Your health care provider may put you on activity or lifting restrictions. ?The incision will continue to drain. It is normal to have some clear or slightly bloody drainage. The amount of drainage should lessen each day. ?Do not apply any creams, ointments, or liquids unless you have been told to by your health care provider. ?Keep all follow-up visits as told by your health care provider. This is important. ?Contact a health care provider if: ?Your cyst or abscess returns. ?You have a fever or chills. ?You have more redness, swelling, or pain around your incision. ?You have more fluid or blood coming from your incision. ?Your incision feels warm to the touch. ?You have pus or a bad smell coming from your incision. ?You have red streaks above or below the incision site. ?Get help right  away if: ?You have severe pain or bleeding. ?You cannot eat or drink without vomiting. ?You have decreased urine output. ?You become short of breath. ?You have chest pain. ?You cough up blood. ?The affected area becomes numb or starts to tingle. ?These symptoms may represent a serious problem that is an emergency. Do not wait to see if the symptoms will go away. Get medical help right away. Call your local emergency services (911 in the [place] Do not drive  yourself to the hospital. ?Summary ?After this procedure, it is common to have fluid, blood, or pus coming from the surgery site. ?Follow all home care instructions. You will be told how to take care of your incision, how to check for infection, and how to take medicines. ?If you were prescribed an antibiotic medicine, take it as told by your health care provider. Do not stop taking the antibiotic even if you start to feel better. ?Contact a health care provider if you have increased redness, swelling, or pain around your incision. Get help right away if you have chest pain, you vomit, you cough up blood, or you have shortness of breath. ?Keep all follow-up visits as told by your health care provider. This is important. ?This information is not intended to replace advice given to you by your health care provider. Make sure you discuss any questions you have with your health care provider. ?Document Revised: 08/19/2018 Document Reviewed: 08/19/2018 ?Elsevier Patient Education ? 2022 [employer] ? ?

## 2022-01-09 ENCOUNTER — Telehealth: Payer: Self-pay | Admitting: Family Medicine

## 2022-01-09 ENCOUNTER — Ambulatory Visit
Admission: RE | Admit: 2022-01-09 | Discharge: 2022-01-09 | Disposition: A | Discharge status: Home / Self Care | Payer: Medicare Other | Source: Ambulatory Visit | Attending: Family Medicine | Admitting: Family Medicine

## 2022-01-09 DIAGNOSIS — Z1231 Encounter for screening mammogram for malignant neoplasm of breast: Secondary | ICD-10-CM | POA: Diagnosis present

## 2022-01-09 NOTE — Telephone Encounter (Signed)
Pt is calling to ask Dr. Ancil Boozer if she can send a referral to Dr. Jeneen Rinks [address] [facility] [address] [place], [place] Pain Doctor ?Pt reports that she has discussed with Dr. Ancil Boozer for 2-3 years about the pain in her knee. ? ?Please advise CB- [phone] ?

## 2022-01-12 NOTE — Telephone Encounter (Signed)
Called pt to get her scheduled and pt states she already had an appt with the specialist and is waiting on her MRI to be scheduled.  ?

## 2022-02-01 ENCOUNTER — Encounter: Payer: Medicare Other | Admitting: Certified Nurse Midwife
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
## 2022-02-03 NOTE — Patient Instructions (Incomplete)

## 2022-02-06 ENCOUNTER — Encounter: Payer: Medicare Other | Admitting: Family Medicine

## 2022-02-09 ENCOUNTER — Encounter: Payer: Medicare Other | Admitting: Certified Nurse Midwife

## 2022-03-06 ENCOUNTER — Encounter: Payer: Self-pay | Admitting: Advanced Practice Midwife

## 2022-03-06 ENCOUNTER — Ambulatory Visit: Payer: Self-pay | Admitting: Advanced Practice Midwife

## 2022-03-06 DIAGNOSIS — Z113 Encounter for screening for infections with a predominantly sexual mode of transmission: Secondary | ICD-10-CM

## 2022-03-06 DIAGNOSIS — B379 Candidiasis, unspecified: Secondary | ICD-10-CM

## 2022-03-06 LAB — WET PREP FOR TRICH, YEAST, CLUE
Trichomonas Exam: NEGATIVE
Yeast Exam: NEGATIVE

## 2022-03-06 LAB — HM HIV SCREENING LAB: HM HIV Screening: NEGATIVE

## 2022-03-06 MED ORDER — CLOTRIMAZOLE 1 % VA CREA: 1.0000 | TOPICAL_CREAM | Freq: Two times a day (BID) | VAGINAL | Status: DC

## 2022-03-06 MED ORDER — CLOTRIMAZOLE 1 % VA CREA: 1.0000 | TOPICAL_CREAM | Freq: Two times a day (BID) | VAGINAL | 0 refills | Status: AC

## 2022-03-06 NOTE — Progress Notes (Signed)
Patient seen for STD testing. Wet prep reviewed, tx dispensed per standing orders.
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
## 2022-04-14 ENCOUNTER — Encounter: Payer: Self-pay | Admitting: Nurse Practitioner

## 2022-04-14 ENCOUNTER — Ambulatory Visit (INDEPENDENT_AMBULATORY_CARE_PROVIDER_SITE_OTHER): Payer: Medicare Other | Admitting: Nurse Practitioner

## 2022-04-14 ENCOUNTER — Other Ambulatory Visit: Payer: Self-pay

## 2022-04-14 VITALS — BP 126/72 | HR 100 | Temp 98.9°F | Resp 18 | Ht 63.0 in | Wt 209.0 lb

## 2022-04-14 DIAGNOSIS — R21 Rash and other nonspecific skin eruption: Secondary | ICD-10-CM | POA: Diagnosis not present

## 2022-04-14 MED ORDER — TRIAMCINOLONE ACETONIDE 0.025 % EX OINT: 1.0000 | TOPICAL_OINTMENT | Freq: Two times a day (BID) | CUTANEOUS | 1 refills | Status: DC

## 2022-04-14 MED ORDER — KETOCONAZOLE 2 % EX CREA: 1.0000 | TOPICAL_CREAM | Freq: Every day | CUTANEOUS | 1 refills | Status: DC

## 2022-04-14 MED ORDER — PREDNISONE 10 MG (21) PO TBPK: ORAL_TABLET | ORAL | 0 refills | Status: DC

## 2022-04-14 MED ORDER — HYDROXYZINE PAMOATE 25 MG PO CAPS: 25.0000 mg | ORAL_CAPSULE | Freq: Three times a day (TID) | ORAL | 0 refills | Status: DC | PRN

## 2022-04-14 NOTE — Progress Notes (Signed)
BP 126/72   Pulse 100   Temp 98.9 F (37.2 C) (Oral)   Resp 18   Ht 5\' 3"  (1.6 m)   Wt 209 lb (94.8 kg)   SpO2 97%   BMI 37.02 kg/m    Subjective:    Patient ID: , female    DOB: 1981-08-15, 41 y.o.   MRN: [id number]  HPI: Raven Ray is a 41 y.o. female  Chief Complaint  Patient presents with   Rash    On back   Rash on back:  Patient has had a pruritic rash on right side of back, mons pubis and left butt cheek.  Patient reports she has been dealing with this rash off and on for several months now.  She says it will go away and then come back.  She has been using ketoconazole, and triamcinolone.  Says that she has seen her GYN about it she has been tested for STDs.  Patient has been using her creams with no improvement.  Patient states that she changed from Tide to Free and clear laundry detergent, she uses nonfragrance soaps.  Patient states she still has these flares of this rash.  Discussed with patient that she needs to continue the cream refill sent, will send in steroid pack and hydroxyzine for itching.  Also recommended that she go to an allergist for allergy testing.  Referral placed.  Relevant past medical, surgical, family and social history reviewed and updated as indicated. Interim medical history since our last visit reviewed. Allergies and medications reviewed and updated.  Review of Systems  Constitutional: Negative for fever or weight change.  Respiratory: Negative for cough and shortness of breath.   Cardiovascular: Negative for chest pain or palpitations.  Gastrointestinal: Negative for abdominal pain, no bowel changes.  Musculoskeletal: Negative for gait problem or joint swelling.  Skin: positive for rash.  Neurological: Negative for dizziness or headache.  No other specific complaints in a complete review of systems (except as listed in HPI above).     Objective:    BP 126/72   Pulse 100   Temp 98.9 F (37.2 C) (Oral)   Resp 18    Ht 5\' 3"  (1.6 m)   Wt 209 lb (94.8 kg)   SpO2 97%   BMI 37.02 kg/m   Wt Readings from Last 3 Encounters:  04/14/22 209 lb (94.8 kg)  12/01/21 210 lb (95.3 kg)  11/22/21 205 lb (93 kg)    Physical Exam  Constitutional: Patient appears well-developed and well-nourished. Obese  No distress.  HEENT: head atraumatic, normocephalic, pupils equal and reactive to light, neck supple Cardiovascular: Normal rate, regular rhythm and normal heart sounds.  No murmur heard. No BLE edema. Pulmonary/Chest: Effort normal and breath sounds normal. No respiratory distress. Abdominal: Soft.  There is no tenderness. SKIN: excoriation and exanthem present on right side of lower back, left butt cheek. Psychiatric: Patient has a normal mood and affect. behavior is normal. Judgment and thought content normal.  Results for orders placed or performed in visit on 03/13/22  [facility] SCREENING LAB  Result Value Ref Range   [facility] HIV Screening Negative - Validated       Assessment & Plan:   Problem List Items Addressed This Visit   None Visit Diagnoses     Rash    -  Primary   Continue using ketoconazole and Triamcinolone cream on rash.  We will send in prescription for steroid pack and hydroxyzine for itching.  Referral to allergy   Relevant Medications   predniSONE (STERAPRED UNI-PAK 21 TAB) 10 MG (21) TBPK tablet   hydrOXYzine (VISTARIL) 25 MG capsule   ketoconazole (NIZORAL) 2 % cream   triamcinolone (KENALOG) 0.025 % ointment   Other Relevant Orders   Ambulatory referral to Allergy        Follow up plan: Return if symptoms worsen or fail to improve.

## 2022-07-03 ENCOUNTER — Encounter: Payer: Self-pay | Admitting: Family Medicine

## 2022-07-03 ENCOUNTER — Ambulatory Visit (INDEPENDENT_AMBULATORY_CARE_PROVIDER_SITE_OTHER): Payer: Medicare Other | Admitting: Family Medicine

## 2022-07-03 ENCOUNTER — Other Ambulatory Visit (HOSPITAL_COMMUNITY)
Admission: RE | Admit: 2022-07-03 | Discharge: 2022-07-03 | Disposition: A | Discharge status: Home / Self Care | Payer: Medicare Other | Source: Ambulatory Visit | Attending: Family Medicine | Admitting: Family Medicine

## 2022-07-03 VITALS — BP 124/80 | HR 95 | Temp 98.2°F | Resp 16 | Ht 63.0 in | Wt 209.5 lb

## 2022-07-03 DIAGNOSIS — R21 Rash and other nonspecific skin eruption: Secondary | ICD-10-CM

## 2022-07-03 DIAGNOSIS — Z113 Encounter for screening for infections with a predominantly sexual mode of transmission: Secondary | ICD-10-CM

## 2022-07-03 DIAGNOSIS — N3001 Acute cystitis with hematuria: Secondary | ICD-10-CM | POA: Diagnosis not present

## 2022-07-03 LAB — POCT URINALYSIS DIPSTICK
Bilirubin, UA: NEGATIVE
Glucose, UA: NEGATIVE
Ketones, UA: NEGATIVE
Nitrite, UA: NEGATIVE
Odor: NORMAL
Protein, UA: NEGATIVE
Spec Grav, UA: 1.02 (ref 1.010–1.025)
Urobilinogen, UA: 0.2 E.U./dL
pH, UA: 5 (ref 5.0–8.0)

## 2022-07-03 MED ORDER — PHENAZOPYRIDINE HCL 95 MG PO TABS: 95.0000 mg | ORAL_TABLET | Freq: Three times a day (TID) | ORAL | 0 refills | Status: DC | PRN

## 2022-07-03 MED ORDER — NITROFURANTOIN MONOHYD MACRO 100 MG PO CAPS: 100.0000 mg | ORAL_CAPSULE | Freq: Two times a day (BID) | ORAL | 0 refills | Status: DC

## 2022-07-03 MED ORDER — TRIAMCINOLONE ACETONIDE 0.025 % EX OINT: 1.0000 | TOPICAL_OINTMENT | Freq: Two times a day (BID) | CUTANEOUS | 1 refills | Status: DC | PRN

## 2022-07-03 MED ORDER — FLUCONAZOLE 150 MG PO TABS: 150.0000 mg | ORAL_TABLET | ORAL | 0 refills | Status: DC | PRN
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
## 2022-07-04 LAB — CERVICOVAGINAL ANCILLARY ONLY
Bacterial Vaginitis (gardnerella): NEGATIVE
Candida Glabrata: NEGATIVE
Candida Vaginitis: POSITIVE — AB
Chlamydia: NEGATIVE
Comment: NEGATIVE
Comment: NEGATIVE
Comment: NEGATIVE
Comment: NEGATIVE
Comment: NEGATIVE
Comment: NORMAL
Neisseria Gonorrhea: NEGATIVE
Trichomonas: NEGATIVE
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
## 2022-07-19 NOTE — Patient Instructions (Signed)
Preventive Care 40-42 Years Old, Female Preventive care refers to lifestyle choices and visits with your health care provider that can promote health and wellness. Preventive care visits are also called wellness exams. What can I expect for my preventive care visit? Counseling Your health care provider may ask you questions about your: Medical history, including: Past medical problems. Family medical history. Pregnancy history. Current health, including: Menstrual cycle. Method of birth control. Emotional well-being. Home life and relationship well-being. Sexual activity and sexual health. Lifestyle, including: Alcohol, nicotine or tobacco, and drug use. Access to firearms. Diet, exercise, and sleep habits. Work and work environment. Sunscreen use. Safety issues such as seatbelt and bike helmet use. Physical exam Your health care provider will check your: Height and weight. These may be used to calculate your BMI (body mass index). BMI is a measurement that tells if you are at a healthy weight. Waist circumference. This measures the distance around your waistline. This measurement also tells if you are at a healthy weight and may help predict your risk of certain diseases, such as type 2 diabetes and high blood pressure. Heart rate and blood pressure. Body temperature. Skin for abnormal spots. What immunizations do I need?  Vaccines are usually given at various ages, according to a schedule. Your health care provider will recommend vaccines for you based on your age, medical history, and lifestyle or other factors, such as travel or where you work. What tests do I need? Screening Your health care provider may recommend screening tests for certain conditions. This may include: Lipid and cholesterol levels. Diabetes screening. This is done by checking your blood sugar (glucose) after you have not eaten for a while (fasting). Pelvic exam and Pap test. Hepatitis B test. Hepatitis C  test. HIV (human immunodeficiency virus) test. STI (sexually transmitted infection) testing, if you are at risk. Lung cancer screening. Colorectal cancer screening. Mammogram. Talk with your health care provider about when you should start having regular mammograms. This may depend on whether you have a family history of breast cancer. BRCA-related cancer screening. This may be done if you have a family history of breast, ovarian, tubal, or peritoneal cancers. Bone density scan. This is done to screen for osteoporosis. Talk with your health care provider about your test results, treatment options, and if necessary, the need for more tests. Follow these instructions at home: Eating and drinking  Eat a diet that includes fresh fruits and vegetables, whole grains, lean protein, and low-fat dairy products. Take vitamin and mineral supplements as recommended by your health care provider. Do not drink alcohol if: Your health care provider tells you not to drink. You are pregnant, may be pregnant, or are planning to become pregnant. If you drink alcohol: Limit how much you have to 0-1 drink a day. Know how much alcohol is in your drink. In the [place] one drink equals one 12 oz bottle of beer (355 mL), one 5 oz glass of wine (148 mL), or one 1 oz glass of hard liquor (44 mL). Lifestyle Brush your teeth every morning and night with fluoride toothpaste. Floss one time each day. Exercise for at least 30 minutes 5 or more days each week. Do not use any products that contain nicotine or tobacco. These products include cigarettes, chewing tobacco, and vaping devices, such as e-cigarettes. If you need help quitting, ask your health care provider. Do not use drugs. If you are sexually active, practice safe sex. Use a condom or other form of protection to   prevent STIs. If you do not wish to become pregnant, use a form of birth control. If you plan to become pregnant, see your health care provider for a  prepregnancy visit. Take aspirin only as told by your health care provider. Make sure that you understand how much to take and what form to take. Work with your health care provider to find out whether it is safe and beneficial for you to take aspirin daily. Find healthy ways to manage stress, such as: Meditation, yoga, or listening to music. Journaling. Talking to a trusted person. Spending time with friends and family. Minimize exposure to UV radiation to reduce your risk of skin cancer. Safety Always wear your seat belt while driving or riding in a vehicle. Do not drive: If you have been drinking alcohol. Do not ride with someone who has been drinking. When you are tired or distracted. While texting. If you have been using any mind-altering substances or drugs. Wear a helmet and other protective equipment during sports activities. If you have firearms in your house, make sure you follow all gun safety procedures. Seek help if you have been physically or sexually abused. What's next? Visit your health care provider once a year for an annual wellness visit. Ask your health care provider how often you should have your eyes and teeth checked. Stay up to date on all vaccines. This information is not intended to replace advice given to you by your health care provider. Make sure you discuss any questions you have with your health care provider. Document Revised: 03/16/2021 Document Reviewed: 03/16/2021 Elsevier Patient Education  [place].

## 2022-07-21 ENCOUNTER — Ambulatory Visit (INDEPENDENT_AMBULATORY_CARE_PROVIDER_SITE_OTHER): Payer: Medicare Other | Admitting: Family Medicine

## 2022-07-21 ENCOUNTER — Other Ambulatory Visit (HOSPITAL_COMMUNITY)
Admission: RE | Admit: 2022-07-21 | Discharge: 2022-07-21 | Disposition: A | Discharge status: Home / Self Care | Payer: Medicare Other | Source: Ambulatory Visit | Attending: Family Medicine | Admitting: Family Medicine

## 2022-07-21 ENCOUNTER — Encounter: Payer: Self-pay | Admitting: Family Medicine

## 2022-07-21 VITALS — BP 122/74 | HR 61 | Resp 16 | Ht 63.0 in | Wt 210.0 lb

## 2022-07-21 DIAGNOSIS — Z124 Encounter for screening for malignant neoplasm of cervix: Secondary | ICD-10-CM | POA: Diagnosis not present

## 2022-07-21 DIAGNOSIS — Z3009 Encounter for other general counseling and advice on contraception: Secondary | ICD-10-CM

## 2022-07-21 DIAGNOSIS — Z23 Encounter for immunization: Secondary | ICD-10-CM | POA: Diagnosis not present

## 2022-07-21 DIAGNOSIS — Z1151 Encounter for screening for human papillomavirus (HPV): Secondary | ICD-10-CM | POA: Diagnosis not present

## 2022-07-21 DIAGNOSIS — Z0000 Encounter for general adult medical examination without abnormal findings: Secondary | ICD-10-CM | POA: Diagnosis not present

## 2022-07-21 DIAGNOSIS — Z3049 Encounter for surveillance of other contraceptives: Secondary | ICD-10-CM

## 2022-07-21 DIAGNOSIS — Z01419 Encounter for gynecological examination (general) (routine) without abnormal findings: Secondary | ICD-10-CM | POA: Diagnosis present

## 2022-07-21 DIAGNOSIS — E559 Vitamin D deficiency, unspecified: Secondary | ICD-10-CM

## 2022-07-21 DIAGNOSIS — Z113 Encounter for screening for infections with a predominantly sexual mode of transmission: Secondary | ICD-10-CM

## 2022-07-21 MED ORDER — ETONOGESTREL-ETHINYL ESTRADIOL 0.12-0.015 MG/24HR VA RING: VAGINAL_RING | VAGINAL | 4 refills | Status: DC

## 2022-07-24 LAB — RPR: RPR Ser Ql: NONREACTIVE

## 2022-07-24 LAB — VITAMIN D 25 HYDROXY (VIT D DEFICIENCY, FRACTURES): Vit D, 25-Hydroxy: 20 ng/mL — ABNORMAL LOW (ref 30–100)

## 2022-07-24 LAB — HIV ANTIBODY (ROUTINE TESTING W REFLEX): HIV 1&2 Ab, 4th Generation: NONREACTIVE

## 2022-07-25 LAB — CYTOLOGY - PAP
Chlamydia: NEGATIVE
Comment: NEGATIVE
Comment: NEGATIVE
Comment: NORMAL
Diagnosis: NEGATIVE
High risk HPV: NEGATIVE
Neisseria Gonorrhea: NEGATIVE

## 2022-08-17 ENCOUNTER — Ambulatory Visit (INDEPENDENT_AMBULATORY_CARE_PROVIDER_SITE_OTHER): Payer: Medicare Other

## 2022-08-17 DIAGNOSIS — Z0000 Encounter for general adult medical examination without abnormal findings: Secondary | ICD-10-CM

## 2022-08-17 NOTE — Patient Instructions (Signed)

## 2022-08-17 NOTE — Progress Notes (Signed)
I connected with  Raven HeadingsLoretta L Person on 08/17/22 by a audio enabled telemedicine application and verified that I am speaking with the correct person using two identifiers.  Patient Location: Home  Provider Location: Office/Clinic  I discussed the limitations of evaluation and management by telemedicine. The patient expressed understanding and agreed to proceed.  Subjective:   Raven Ray is a 41 y.o. female who presents for Medicare Annual (Subsequent) preventive examination.  Review of Systems    Cardiac Risk Factors include: advanced age (>4855men, 39>65 women);female gender          Objective:       07/21/2022    1:25 PM 07/03/2022   10:51 AM 04/14/2022    2:57 PM  Vitals with BMI  Height 5\' 3"  5\' 3"  5\' 3"   Weight 210 lbs 209 lbs 8 oz 209 lbs  BMI 37.21 37.12 37.03  Systolic 122 124 [id number]  Diastolic 74 80 72  Pulse 61 95 100    There were no vitals filed for this visit. There is no height or weight on file to calculate BMI.     08/17/2022    3:50 PM 08/16/2021   11:29 AM 07/06/2020    3:01 PM 06/26/2019   10:28 AM 06/18/2018    2:59 PM 06/14/2017    9:15 AM 06/11/2017   11:03 AM  Advanced Directives  Does Patient Have a Medical Advance Directive? No No No No No No No  Would patient like information on creating a medical advance directive? No - Patient declined Yes (MAU/Ambulatory/Procedural Areas - Information given) Yes (MAU/Ambulatory/Procedural Areas - Information given) Yes (MAU/Ambulatory/Procedural Areas - Information given) Yes (MAU/Ambulatory/Procedural Areas - Information given)      Current Medications (verified) Outpatient Encounter Medications as of 08/17/2022  Medication Sig   Cholecalciferol (VITAMIN D) 50 MCG (2000 UT) CAPS Take 1 Capful by mouth daily at 12 noon.   etonogestrel-ethinyl estradiol (NUVARING) 0.12-0.015 MG/24HR vaginal ring Insert vaginally and leave in place for 3 consecutive weeks, then remove for 1 week.   ketoconazole (NIZORAL) 2 %  cream Apply 1 Application topically daily.   omeprazole (PRILOSEC) 20 MG capsule Take by mouth daily. PRN only   triamcinolone (KENALOG) 0.025 % ointment Apply 1 Application topically 2 (two) times daily as needed (rash, itching, raised patches). Do not apply to face   pyridostigmine (MESTINON) 60 MG tablet Take 1 tablet by mouth daily as needed.   No facility-administered encounter medications on file as of 08/17/2022.    Allergies (verified) Patient has no known allergies.   History: Past Medical History:  Diagnosis Date   GERD (gastroesophageal reflux disease) 03/08/2018   Myasthenia gravis [facility](HCC)    Past Surgical History:  Procedure Laterality Date   CESAREAN SECTION  10/10/1999   TOTAL THYMECTOMY  1991   Family History  Problem Relation Age of Onset   Hypertension Mother    Migraines Mother    Hypertension Father    Diabetes Maternal Grandmother    Breast cancer Neg Hx    Social History   Socioeconomic History   Marital status: Single    Spouse name: Not on file   Number of children: 2   Years of education: some college   Highest education level: Associate degree: occupational, Scientist, product/process developmenttechnical, or vocational program  Occupational History   Occupation: Disabled  Tobacco Use   Smoking status: Former    Types: Cigarettes   Smokeless tobacco: Never   Tobacco comments:    smoking cessation  materials not required  Vaping Use   Vaping Use: Never used  Substance and Sexual Activity   Alcohol use: Not Currently    Comment: last use age 41   Drug use: No   Sexual activity: Not Currently    Partners: Male    Birth control/protection: Other-see comments    Comment: Nuva Ring  Other Topics Concern   Not on file  Social History Narrative   She is on disability but she states she went back to work part time to not feel disabled. 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       She completed school to become a para-legal       Social Determinants of Health   Financial  Resource Strain: Low Risk  (07/21/2022)   Overall Financial Resource Strain (CARDIA)    Difficulty of Paying Living Expenses: Not hard at all  Food Insecurity: No Food Insecurity (08/17/2022)   Hunger Vital Sign    Worried About Running Out of Food in the Last Year: Never true    Ran Out of Food in the Last Year: Never true  Transportation Needs: No Transportation Needs (07/21/2022)   PRAPARE - Administrator, Civil Service (Medical): No    Lack of Transportation (Non-Medical): No  Physical Activity: Inactive (08/17/2022)   Exercise Vital Sign    Days of Exercise per Week: 0 days    Minutes of Exercise per Session: 0 min  Stress: No Stress Concern Present (07/21/2022)   [employer] of Occupational Health - Occupational Stress Questionnaire    Feeling of Stress : Not at all  Social Connections: Moderately Integrated (07/21/2022)   Social Connection and Isolation Panel [NHANES]    Frequency of Communication with Friends and Family: More than three times a week    Frequency of Social Gatherings with Friends and Family: More than three times a week    Attends Religious Services: More than 4 times per year    Active Member of [employer] or Organizations: Yes    Attends Banker Meetings: 1 to 4 times per year    Marital Status: Never married    Tobacco Counseling Counseling given: No Tobacco comments: smoking cessation materials not required   Clinical Intake:  Pre-visit preparation completed: No  Pain : No/denies pain     Nutritional Risks: None Diabetes: No  How often do you need to have someone help you when you read instructions, pamphlets, or other written materials from your doctor or pharmacy?: 1 - Never  Diabetic?No   Interpreter Needed?: No  Information entered by :: Raven Ray, CMA   Activities of Daily Living    07/21/2022    1:21 PM 07/03/2022   10:50 AM  In your present state of health, do you have any difficulty performing  the following activities:  Hearing? 0 0  Vision? 0 0  Difficulty concentrating or making decisions? 0 0  Walking or climbing stairs? 0 0  Dressing or bathing? 0 0  Doing errands, shopping? 0 0    Patient Care Team: Alba Cory, MD as PCP - General (Family Medicine) Lonell Face, MD as Consulting Physician (Neurology)  Indicate any recent Medical Services you may have received from other than [facility] providers in the past year (date may be approximate). No hospitalization in the past 12 months.     Assessment:   This is a routine wellness examination for Ellen.  Hearing/Vision screen Denies any hearing issues. Wear glasses, Annual Eye  Exam 08/09/2022, [facility]   Dietary issues and exercise activities discussed: Current Exercise Habits: The patient does not participate in regular exercise at present   Goals Addressed   None    Depression Screen    07/21/2022    1:25 PM 07/03/2022   10:50 AM 04/14/2022    2:59 PM 11/09/2021   11:31 AM 08/16/2021   11:28 AM 08/08/2021    9:31 AM 12/20/2020    3:27 PM  PHQ 2/9 Scores  PHQ - 2 Score 0 0 0 0 0 0 0  PHQ- 9 Score 0 0  0  0     Fall Risk    08/17/2022    3:19 PM 07/21/2022    1:21 PM 07/03/2022   10:50 AM 04/14/2022    2:59 PM 11/22/2021    3:34 PM  Fall Risk   Falls in the past year? 0 0 0 0 0  Number falls in past yr: 0 0 0 0   Injury with Fall? 0 0 0 0   Risk for fall due to : No Fall Risks No Fall Risks No Fall Risks    Follow up Falls evaluation completed Falls prevention discussed Falls prevention discussed;Education provided;Falls evaluation completed Falls evaluation completed     FALL RISK PREVENTION PERTAINING TO THE HOME:  Any stairs in or around the home? Yes  If so, are there any without handrails? No  Home free of loose throw rugs in walkways, pet beds, electrical cords, etc? Yes  Adequate lighting in your home to reduce risk of falls? Yes   ASSISTIVE DEVICES UTILIZED TO PREVENT  FALLS:  Life alert? No  Use of a cane, walker or w/c? No  Grab bars in the bathroom? No  Shower chair or bench in shower? No  Elevated toilet seat or a handicapped toilet? No   TIMED UP AND GO:  Was the test performed? No .  Telephone visit    Cognitive Function:        08/17/2022    3:20 PM 06/18/2018    3:08 PM  6CIT Screen  What Year? 0 points 0 points  What month? 0 points 0 points  What time? 0 points 0 points  Count back from 20 0 points 0 points  Months in reverse 0 points 0 points  Repeat phrase 2 points 0 points  Total Score 2 points 0 points    Immunizations Immunization History  Administered Date(s) Administered   Hep A / Hep B 05/03/2006, 06/07/2006, 12/13/2010   Influenza,inj,Quad PF,6+ Mos 06/14/2017, 07/21/2022   PNEUMOCOCCAL CONJUGATE-20 07/21/2022   Tdap 06/10/2013    TDAP status: Up to date  Flu Vaccine status: Up to date  Pneumococcal vaccine status: Up to date  Covid-19 vaccine status: Information provided on how to obtain vaccines.   Qualifies for Shingles Vaccine? No   Zostavax completed No   Shingrix Completed?: No.    Education has been provided regarding the importance of this vaccine. Patient has been advised to call insurance company to determine out of pocket expense if they have not yet received this vaccine. Advised may also receive vaccine at local pharmacy or Health Dept. Verbalized acceptance and understanding.  Screening Tests Health Maintenance  Topic Date Due   MAMMOGRAM  01/10/2023   TETANUS/TDAP  06/11/2023   Medicare Annual Wellness (AWV)  08/18/2023   PAP SMEAR-Modifier  07/22/2027   Hepatitis C Screening  Completed   HIV Screening  Completed   HPV VACCINES  Aged  Out   INFLUENZA VACCINE  Discontinued   COVID-19 Vaccine  Discontinued    Health Maintenance  There are no preventive care reminders to display for this patient.   Mammogram status: Completed 01/09/2022. Repeat every year    Lung Cancer  Screening: (Low Dose CT Chest recommended if Age 78-80 years, 30 pack-year currently smoking OR have quit w/in 15years.) does not qualify.   Lung Cancer Screening Referral: No  Additional Screening:  Hepatitis C Screening: does qualify; Completed 12/11/2020  Vision Screening: Recommended annual ophthalmology exams for early detection of glaucoma and other disorders of the eye. Is the patient up to date with their annual eye exam?  Yes  Who is the provider or what is the name of the office in which the patient attends annual eye exams? [facility]  If pt is not established with a provider, would they like to be referred to a provider to establish care? No .   Dental Screening: Recommended annual dental exams for proper oral hygiene  Community Resource Referral / Chronic Care Management: CRR required this visit?  No   CCM required this visit?  No      Plan:     I have personally reviewed and noted the following in the patient's chart:   Medical and social history Use of alcohol, tobacco or illicit drugs  Current medications and supplements including opioid prescriptions. Patient is not currently taking opioid prescriptions. Functional ability and status Nutritional status Physical activity Advanced directives List of other physicians Hospitalizations, surgeries, and ER visits in previous 12 months Vitals Screenings to include cognitive, depression, and falls Referrals and appointments  In addition, I have reviewed and discussed with patient certain preventive protocols, quality metrics, and best practice recommendations. A written personalized care plan for preventive services as well as general preventive health recommendations were provided to patient.    Ms. Spinner , Thank you for taking time to come for your Medicare Wellness Visit. I appreciate your ongoing commitment to your health goals. Please review the following plan we discussed and let me know if I can  assist you in the future.   These are the goals we discussed:  Goals      DIET - INCREASE WATER INTAKE     Recommend to drink at least 6-8 8oz glasses of water per day.     Exercise 3x per week (30 min per time)     Recommend increasing exercise to 3 times a week for at least 30 minutes.         This is a list of the screening recommended for you and due dates:  Health Maintenance  Topic Date Due   Mammogram  01/10/2023   Tetanus Vaccine  06/11/2023   Medicare Annual Wellness Visit  08/18/2023   Pap Smear  07/22/2027   Hepatitis C Screening: USPSTF Recommendation to screen - Ages 18-79 yo.  Completed   HIV Screening  Completed   HPV Vaccine  Aged Out   Flu Shot  Discontinued   COVID-19 Vaccine  Discontinued      Lonna Cobb, [facility]   08/17/2022   Nurse Notes: Approximately 30 minute Non-Face-To-Face Wellness Visit

## 2022-10-20 ENCOUNTER — Ambulatory Visit: Payer: Self-pay | Admitting: Nurse Practitioner

## 2022-10-20 DIAGNOSIS — Z113 Encounter for screening for infections with a predominantly sexual mode of transmission: Secondary | ICD-10-CM

## 2022-10-20 LAB — WET PREP FOR TRICH, YEAST, CLUE
Trichomonas Exam: NEGATIVE
Yeast Exam: NEGATIVE

## 2022-10-20 LAB — HM HIV SCREENING LAB: HM HIV Screening: NEGATIVE

## 2022-10-20 NOTE — Progress Notes (Signed)
Pt is here for STD screening.  Wet mount results reviewed, no treatment required.  Sumie Remsen M Treina Arscott, RN  
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
## 2022-11-10 ENCOUNTER — Other Ambulatory Visit: Payer: Self-pay | Admitting: Nurse Practitioner

## 2022-11-10 DIAGNOSIS — R21 Rash and other nonspecific skin eruption: Secondary | ICD-10-CM

## 2022-11-10 NOTE — Telephone Encounter (Signed)
Requested medication (s) are due for refill today -yes  Requested medication (s) are on the active medication list -yes  Future visit scheduled -yes  Last refill: 04/14/22 15g 1RF  Notes to clinic: non delegated Rx  Requested Prescriptions  Pending Prescriptions Disp Refills   ketoconazole (NIZORAL) 2 % cream [Pharmacy Med Name: KETOCONAZOLE 2% CREAM] 15 g 1    Sig: Apply 1 Application topically daily.     Not Delegated - Over the Counter: OTC 2 Failed - 11/10/2022 11:26 AM      Failed - This refill cannot be delegated      Passed - Valid encounter within last 12 months    Recent Outpatient Visits           3 months ago Well adult exam   [facility] Steele Sizer, MD   4 months ago Acute cystitis with hematuria   [facility] Delsa Grana, PA-C   7 months ago [facility] Bo Merino, FNP   1 year ago Cyst of buttocks   [facility] Steele Sizer, MD   1 year ago Myasthenia gravis [facility]   [facility] Steele Sizer, MD       Future Appointments             In 2 months Steele Sizer, MD [facility], [place]   In 8 months Steele Sizer, MD [facility], [facility]               Requested Prescriptions  Pending Prescriptions Disp Refills   ketoconazole (NIZORAL) 2 % cream [Pharmacy Med Name: KETOCONAZOLE 2% CREAM] 15 g 1    Sig: Apply 1 Application topically daily.     Not Delegated - Over the Counter: OTC 2 Failed - 11/10/2022 11:26 AM      Failed - This refill cannot be delegated      Passed - Valid encounter within last 12 months    Recent Outpatient Visits           3 months ago Well adult exam   [facility] Steele Sizer, MD   4 months ago Acute cystitis with hematuria   [facility] Delsa Grana, PA-C   7  months ago [facility] Bo Merino, FNP   1 year ago Cyst of buttocks   [facility] Steele Sizer, MD   1 year ago Myasthenia gravis [facility])   [facility] Steele Sizer, MD       Future Appointments             In 2 months Ancil Boozer, Drue Stager, MD [facility], [place]   In 8 months Steele Sizer, MD [facility], [facility]
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
## 2023-01-19 ENCOUNTER — Ambulatory Visit: Payer: 59 | Admitting: Family Medicine

## 2023-01-19 DIAGNOSIS — Z1231 Encounter for screening mammogram for malignant neoplasm of breast: Secondary | ICD-10-CM

## 2023-06-06 ENCOUNTER — Ambulatory Visit (INDEPENDENT_AMBULATORY_CARE_PROVIDER_SITE_OTHER): Payer: 59 | Admitting: Surgery

## 2023-06-06 ENCOUNTER — Encounter: Payer: Self-pay | Admitting: Surgery

## 2023-06-06 VITALS — BP 132/72 | HR 98 | Temp 99.1°F | Ht 63.0 in | Wt 204.6 lb

## 2023-06-06 DIAGNOSIS — K611 Rectal abscess: Secondary | ICD-10-CM | POA: Diagnosis not present

## 2023-06-06 DIAGNOSIS — L0231 Cutaneous abscess of buttock: Secondary | ICD-10-CM

## 2023-06-06 MED ORDER — AMOXICILLIN-POT CLAVULANATE 875-125 MG PO TABS: 1.0000 | ORAL_TABLET | Freq: Two times a day (BID) | ORAL | 0 refills | Status: AC

## 2023-06-06 NOTE — Patient Instructions (Signed)
Remove your packing daily and repack the area. Place a dry gauze over the area and tape in place.  Today we have drained your Abscess in the office. The numbing medication will wear off in approximately 4-8 hours. You will have some pain to the area afterwards but should not be as severe as prior to the procedure.  If you have been given antibiotics, please continue to take them after your procedure.  You may take 2 extra strength Tylenol, or 3 regular Ibuprofen tablets every 6 hours as needed for pain and discomfort.  You may shower. First remove all of the packing and wash letting the warm soapy water run over the area, rinse well, and pat dry.    We will see you back as scheduled below.   If you have any questions or concerns prior to your appointment, please call our office and speak with a nurse.  Incision and Drainage Incision and drainage is a surgical procedure to open and drain a fluid-filled sac. The sac may be filled with pus, mucus, or blood. Examples of fluid-filled sacs that may need surgical drainage include cysts, skin infections (abscesses), and red lumps that develop from a ruptured cyst or a small abscess (boils). You may need this procedure if the affected area is large, painful, infected, or not healing well. Tell a health care provider about: Any allergies you have. All medicines you are taking, including vitamins, herbs, eye drops, creams, and over-the-counter medicines. Any problems you or family members have had with anesthetic medicines. Any blood disorders you have. Any surgeries you have had. Any medical conditions you have. Whether you are pregnant or may be pregnant. What are the risks? Generally, this is a safe procedure. However, problems may occur, including: Infection. Bleeding. Allergic reactions to medicines. Scarring.  What happens before the procedure? You may need an ultrasound or other imaging tests to see how large or deep the fluid-filled  sac is. You may have blood tests to check for infection. You may get a tetanus shot. You may be given antibiotic medicine to help prevent infection. Follow instructions from your health care provider about eating or drinking restrictions. Ask your health care provider about: Changing or stopping your regular medicines. This is especially important if you are taking diabetes medicines or blood thinners. Taking medicines such as aspirin and ibuprofen. These medicines can thin your blood. Do not take these medicines before your procedure if your health care provider instructs you not to. Plan to have someone take you home after the procedure. If you will be going home right after the procedure, plan to have someone stay with you for 24 hours. What happens during the procedure? To reduce your risk of infection: Your health care team will wash or sanitize their hands. Your skin will be washed with soap. You will be given one or more of the following: A medicine to help you relax (sedative). A medicine to numb the area (local anesthetic). A medicine to make you fall asleep (general anesthetic). An incision will be made in the top of the fluid-filled sac. The contents of the sac may be squeezed out, or a syringe or tube (catheter) may be used to empty the sac. The catheter may be left in place for several weeks to drain any fluid. Or, your health care provider may stitch open the edges of the incision to make a long-term opening for drainage (marsupialization). The inside of the sac may be washed out (irrigated) with a sterile  solution and packed with gauze before it is covered with a bandage (dressing). The procedure may vary among health care providers and hospitals. What happens after the procedure? Your blood pressure, heart rate, breathing rate, and blood oxygen level will be monitored often until the medicines you were given have worn off. Do not drive for 24 hours if you received a  sedative. This information is not intended to replace advice given to you by your health care provider. Make sure you discuss any questions you have with your health care provider. Document Released: 03/14/2001 Document Revised: 02/24/2016 Document Reviewed: 07/09/2015 Elsevier Interactive Patient Education  2017 [employer]   Incision and Drainage, Care After Refer to this sheet in the next few weeks. These instructions provide you with information about caring for yourself after your procedure. Your health care provider may also give you more specific instructions. Your treatment has been planned according to current medical practices, but problems sometimes occur. Call your health care provider if you have any problems or questions after your procedure. What can I expect after the procedure? After the procedure, it is common to have: Pain or discomfort around your incision site. Drainage from your incision.  Follow these instructions at home: Take over-the-counter and prescription medicines only as told by your health care provider. If you were prescribed an antibiotic medicine, take it as told by your health care provider. Do not stop taking the antibiotic even if you start to feel better. Follow instructions from your health care provider about: How to take care of your incision. When and how you should change your packing and bandage (dressing). Wash your hands with soap and water before you change your dressing. If soap and water are not available, use hand sanitizer. When you should remove your dressing. Do not take baths, swim, or use a hot tub until your health care provider approves. Keep all follow-up visits as told by your health care provider. This is important. Check your incision area every day for signs of infection. Check for: More redness, swelling, or pain. More fluid or blood. Warmth. Pus or a bad smell. Contact a health care provider if: Your cyst or abscess  returns. You have a fever. You have more redness, swelling, or pain around your incision. You have more fluid or blood coming from your incision. Your incision feels warm to the touch. You have pus or a bad smell coming from your incision. Get help right away if: You have severe pain or bleeding. You cannot eat or drink without vomiting. You have decreased urine output. You become short of breath. You have chest pain. You cough up blood. The area where the incision and drainage occurred becomes numb or it tingles. This information is not intended to replace advice given to you by your health care provider. Make sure you discuss any questions you have with your health care provider. Document Released: 12/11/2011 Document Revised: 02/18/2016 Document Reviewed: 07/09/2015 Elsevier Interactive Patient Education  2017 [employer].

## 2023-06-08 ENCOUNTER — Encounter: Payer: Self-pay | Admitting: Family Medicine

## 2023-06-08 ENCOUNTER — Encounter: Payer: Self-pay | Admitting: Surgery

## 2023-06-08 ENCOUNTER — Ambulatory Visit: Payer: 59 | Admitting: Family Medicine

## 2023-06-08 DIAGNOSIS — B379 Candidiasis, unspecified: Secondary | ICD-10-CM

## 2023-06-08 DIAGNOSIS — Z113 Encounter for screening for infections with a predominantly sexual mode of transmission: Secondary | ICD-10-CM

## 2023-06-08 LAB — WET PREP FOR TRICH, YEAST, CLUE: Trichomonas Exam: NEGATIVE

## 2023-06-08 LAB — HM HIV SCREENING LAB: HM HIV Screening: NEGATIVE

## 2023-06-08 MED ORDER — FLUCONAZOLE 150 MG PO TABS: 150.0000 mg | ORAL_TABLET | Freq: Once | ORAL | 0 refills | Status: DC

## 2023-06-08 MED ORDER — FLUCONAZOLE 150 MG PO TABS
150.0000 mg | ORAL_TABLET | Freq: Once | ORAL | Status: AC
Start: 2023-06-08 — End: 2023-06-08

## 2023-06-08 NOTE — Progress Notes (Signed)
Pt is here for STD visit.  Wet prep results reviewed with pt,  treatment required per standing order.  Condoms declined.   The patient was dispensed fluconazolex1  today. I provided counseling today regarding the medication. We discussed the medication, the side effects and when to call clinic. Patient given the opportunity to ask questions. Questions answered.  Gaspar Garbe, RN
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
## 2023-06-08 NOTE — Progress Notes (Signed)
[facility] Department  STI clinic/screening visit [address] [place] [address] [phone]  Subjective:  Raven Ray is a 42 y.o. female being seen today for an STI screening visit. The patient reports they do not have symptoms.  Patient reports that they do not desire a pregnancy in the next year.   They reported they are not interested in discussing contraception today.    Patient's last menstrual period was 05/11/2023 (exact date).  Patient has the following medical conditions:   Patient Active Problem List   Diagnosis Date Noted   Vitamin D deficiency 07/21/2022   Morbid obesity (HCC) 210 lbs 03/06/2022   Cyst of buttocks 11/10/2021   Chronic pain of left knee 03/17/2019   Myasthenia gravis (HCC) 10/10/2016   Eczema 10/10/2016    Chief Complaint  Patient presents with   SEXUALLY TRANSMITTED DISEASE    HPI  Patient reports no symptoms A cyst in the vulvar area Desires to self swab  No smoking No incarceration No homelessness No drugs Some yeast symptoms Sex with men  No MSM No Hep C, B, HIV Last sex 2 weeks # partners in last 2 months  - 1 Sex in last 2 weeks - 2 No anxiety or depression No abuse   Does the patient using douching products? No  Last HIV test per patient/review of record was  Lab Results  Component Value Date   HMHIVSCREEN Negative - Validated 10/20/2022    Lab Results  Component Value Date   HIV NON-REACTIVE 07/21/2022   Patient reports last pap was  Lab Results  Component Value Date   DIAGPAP  07/21/2022    - Negative for intraepithelial lesion or malignancy (NILM)   No results found for: "SPECADGYN"  Screening for MPX risk: Does the patient have an unexplained rash? No Is the patient MSM? No Does the patient endorse multiple sex partners or anonymous sex partners? No Is there a high clinical suspicion for MPX-- evidenced by one of the following No  -Unlikely to be  chickenpox  -Lymphadenopathy  -Rash that present in same phase of evolution on any given body part See flowsheet for further details and programmatic requirements.   Immunization history:  Immunization History  Administered Date(s) Administered   Hep A / Hep B 05/03/2006, 06/07/2006, 12/13/2010   Influenza,inj,Quad PF,6+ Mos 06/14/2017, 07/21/2022   PNEUMOCOCCAL CONJUGATE-20 07/21/2022   Tdap 06/10/2013     The following portions of the patient's history were reviewed and updated as appropriate: allergies, current medications, past medical history, past social history, past surgical history and problem list.  Objective:  There were no vitals filed for this visit.  Physical Exam Constitutional:      General: She is not in acute distress.    Appearance: Normal appearance. She is not ill-appearing, toxic-appearing or diaphoretic.  HENT:     Head: Normocephalic.  Pulmonary:     Effort: Pulmonary effort is normal.  Musculoskeletal:        General: Normal range of motion.  Skin:    General: Skin is warm.  Neurological:     General: No focal deficit present.     Mental Status: She is alert and oriented to person, place, and time.     Cranial Nerves: No cranial nerve deficit.     Motor: No weakness.  Psychiatric:        Mood and Affect: Mood normal.        Behavior: Behavior normal.    Patient  politely declines pelvic exam today and elects for self-collection.   Assessment and Plan:  Raven Ray is a 42 y.o. female presenting to the [facility] Department for STI screening  Routine screening for STI (sexually transmitted infection) -     Chlamydia/Gonorrhea [place] -     WET PREP FOR TRICH, YEAST, CLUE -     HIV [place] [facility] -     Gonococcus culture -     Syphilis Serology, [facility] [facility]  Yeast detected -     Fluconazole; Take 1 tablet (150 mg total) by mouth once for 1 dose.   Patient accepted all screenings including oral, vaginal CT/GC and  bloodwork for HIV/RPR, and wet prep. Patient meets criteria for HepB screening? No. Ordered? not applicable Patient meets criteria for HepC screening? No. Ordered? not applicable  Treat wet prep per standing order Discussed time line for [facility] results and that patient will be called with positive results and encouraged patient to call if she had not heard in 2 weeks.  Counseled to return or seek care for continued or worsening symptoms Recommended repeat testing in 3 months with positive results. Recommended condom use with all sex  Patient is currently using  abstinence  to prevent pregnancy.    No follow-ups on file.  Future Appointments  Date Time Provider Department Center  06/13/2023  8:45 AM Leafy Ro, MD [facility] None  07/24/2023  2:00 PM Alba Cory, MD [facility]  08/21/2023  2:40 PM [facility]NUAL WELLNESS VISIT [facility]   Clydene Fake, MD

## 2023-06-13 ENCOUNTER — Encounter: Payer: Self-pay | Admitting: Surgery

## 2023-06-13 ENCOUNTER — Ambulatory Visit (INDEPENDENT_AMBULATORY_CARE_PROVIDER_SITE_OTHER): Payer: 59 | Admitting: Surgery

## 2023-06-13 VITALS — BP 128/90 | HR 77 | Temp 98.9°F | Ht 63.0 in | Wt 207.0 lb

## 2023-06-13 DIAGNOSIS — K611 Rectal abscess: Secondary | ICD-10-CM

## 2023-06-13 DIAGNOSIS — Z09 Encounter for follow-up examination after completed treatment for conditions other than malignant neoplasm: Secondary | ICD-10-CM

## 2023-06-13 LAB — ANAEROBIC AND AEROBIC CULTURE

## 2023-06-13 LAB — GONOCOCCUS CULTURE

## 2023-06-13 NOTE — Patient Instructions (Signed)
No need to pack the area. Do shower and wash the area and rinse well. Use a clean dry gauze to cover the area until it heals and fully closes. Do finish all of your antibiotics.   Follow-up with our office as needed.  Please call and ask to speak with a nurse if you develop questions or concerns.

## 2023-06-13 NOTE — Progress Notes (Signed)
Raven Ray Is following after I&D of perirectal abscess ago.  She is doing well.  No fevers no chills.  She still packing. Partial compliance with antibiotic therapy  PE NAD ABd: soft, nt Healing well.  Superficial cavity.  No need for further packing.  Sterile dressing placed.  A/p doing well without complications.  Encouraged about being compliant with antibiotic therapy.  Follow-up as needed

## 2023-06-22 ENCOUNTER — Ambulatory Visit (INDEPENDENT_AMBULATORY_CARE_PROVIDER_SITE_OTHER): Payer: 59

## 2023-06-22 ENCOUNTER — Other Ambulatory Visit: Payer: Self-pay | Admitting: Family Medicine

## 2023-06-22 VITALS — BP 106/66 | Ht 64.0 in | Wt 205.6 lb

## 2023-06-22 DIAGNOSIS — Z0000 Encounter for general adult medical examination without abnormal findings: Secondary | ICD-10-CM

## 2023-06-22 DIAGNOSIS — Z1231 Encounter for screening mammogram for malignant neoplasm of breast: Secondary | ICD-10-CM

## 2023-06-22 MED ORDER — PYRIDOSTIGMINE BROMIDE 60 MG PO TABS: 60.0000 mg | ORAL_TABLET | Freq: Every day | ORAL | 1 refills | Status: AC | PRN
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
## 2023-06-22 NOTE — Patient Instructions (Addendum)
Raven Ray , Thank you for taking time to come for your Medicare Wellness Visit. I appreciate your ongoing commitment to your health goals. Please review the following plan we discussed and let me know if I can assist you in the future.   Referrals/Orders/Follow-Ups/Clinician Recommendations: MMG, medication refill request  You have an order for:  [x]   2D Mammogram  []   3D Mammogram  []   Bone Density     Please call for appointment:  [facility]  [address] [place] [address] [phone]  [facility] [address] [place], [place] [address] [phone]   Imaging at [facility] [address] Geanie Logan [place], [place] [address] [phone]    Make sure to wear two-piece clothing.  No lotions, powders, or deodorants the day of the appointment. Make sure to bring picture ID and insurance card.  Bring list of medications you are currently taking including any supplements.   Schedule your  screening mammogram through MyChart!   Log into your MyChart account.  Go to 'Visit' (or 'Appointments' if on mobile App) --> Schedule an Appointment  Under 'Select a Reason for Visit' choose the Mammogram Screening option.  Complete the pre-visit questions and select the time and place that best fits your schedule.    This is a list of the screening recommended for you and due dates:  Health Maintenance  Topic Date Due   Mammogram  01/10/2023   DTaP/Tdap/Td vaccine (2 - Td or Tdap) 06/11/2023   Medicare Annual Wellness Visit  06/21/2024   Pap with HPV screening  07/22/2027   Hepatitis C Screening  Completed   HIV Screening  Completed   HPV Vaccine  Aged Out   Flu Shot  Discontinued   COVID-19 Vaccine  Discontinued    Advanced directives: (Declined) Advance directive discussed with you today. Even though you declined this today, please call our  office should you change your mind, and we can give you the proper paperwork for you to fill out.  Next Medicare Annual Wellness Visit scheduled for next year: Yes 06/27/2024 @ 1pm in person

## 2023-07-02 ENCOUNTER — Encounter: Payer: Self-pay | Admitting: Surgery

## 2023-07-02 ENCOUNTER — Ambulatory Visit (INDEPENDENT_AMBULATORY_CARE_PROVIDER_SITE_OTHER): Payer: 59 | Admitting: Surgery

## 2023-07-02 VITALS — BP 121/78 | HR 93 | Temp 98.7°F | Ht 63.0 in | Wt 206.2 lb

## 2023-07-02 DIAGNOSIS — Z09 Encounter for follow-up examination after completed treatment for conditions other than malignant neoplasm: Secondary | ICD-10-CM

## 2023-07-02 DIAGNOSIS — K611 Rectal abscess: Secondary | ICD-10-CM

## 2023-07-02 NOTE — Patient Instructions (Signed)
Epidermoid Cyst  An epidermoid cyst, also known as epidermal cyst, is a sac made of skin tissue. The sac contains a substance called keratin. Keratin is a protein that is normally secreted through the hair follicles. When keratin becomes trapped in the top layer of skin (epidermis), it can form an epidermoid cyst. Epidermoid cysts can be found anywhere on your body. These cysts are usually harmless (benign), and they may not cause symptoms unless they become inflamed or infected. What are the causes? This condition may be caused by: A blocked hair follicle. A hair that curls and re-enters the skin instead of growing straight out of the skin (ingrown hair). A blocked pore. Irritated skin. An injury to the skin. Certain conditions that are passed along from parent to child (inherited). Human papillomavirus (HPV). This happens rarely when cysts occur on the bottom of the feet. Long-term (chronic) sun damage to the skin. What increases the risk? The following factors may make you more likely to develop an epidermoid cyst: Having acne. Being female. Having an injury to the skin. Being past puberty. Having certain rare genetic disorders. What are the signs or symptoms? The only symptom of this condition may be a small, painless lump underneath the skin. When an epidermal cyst ruptures, it may become inflamed. True infection in cysts is rare. Symptoms may include: Redness. Inflammation. Tenderness. Warmth. Keratin draining from the cyst. Keratin is grayish-white, bad-smelling substance. Pus draining from the cyst. How is this diagnosed? This condition is diagnosed with a physical exam. In some cases, you may have a sample of tissue (biopsy) taken from your cyst to be examined under a microscope or tested for bacteria. You may be referred to a health care provider who specializes in skin care (dermatologist). How is this treated? If a cyst becomes inflamed, treatment may include: Opening and  draining the cyst, done by a health care provider. After draining, minor surgery to remove the rest of the cyst may be done. Taking antibiotic medicine. Having injections of medicines (steroids) that help to reduce inflammation. Having surgery to remove the cyst. Surgery may be done if the cyst: Becomes large. Bothers you. Has a chance of turning into cancer. Do not try to open a cyst yourself. Follow these instructions at home: Medicines If you were prescribed an antibiotic medicine, take it it as told by your health care provider. Do not stop using the antibiotic even if you start to feel better. Take over-the-counter and prescription medicines only as told by your health care provider. General instructions Keep the area around your cyst clean and dry. Wear loose, dry clothing. Avoid touching your cyst. Check your cyst every day for signs of infection. Check for: Redness, swelling, or pain. Fluid or blood. Warmth. Pus or a bad smell. Keep all follow-up visits. This is important. How is this prevented? Wear clean, dry, clothing. Avoid wearing tight clothing. Keep your skin clean and dry. Take showers or baths every day. Contact a health care provider if: Your cyst develops symptoms of infection. Your condition is not improving or is getting worse. You develop a cyst that looks different from other cysts you have had. You have a fever. Get help right away if: Redness spreads from the cyst into the surrounding area. Summary An epidermoid cyst is a sac made of skin tissue. These cysts are usually harmless (benign), and they may not cause symptoms unless they become inflamed. If a cyst becomes inflamed, treatment may include surgery to open and drain the  cyst, or to remove it. Treatment may also include medicines by mouth or through an injection. Take over-the-counter and prescription medicines only as told by your health care provider. If you were prescribed an antibiotic medicine,  take it as told by your health care provider. Do not stop using the antibiotic even if you start to feel better. Contact a health care provider if your condition is not improving or is getting worse. Keep all follow-up visits as told by your health care provider. This is important. This information is not intended to replace advice given to you by your health care provider. Make sure you discuss any questions you have with your health care provider. Document Revised: 12/23/2019 Document Reviewed: 12/24/2019 Elsevier Patient Education  2024 [employer].

## 2023-07-04 NOTE — Progress Notes (Signed)
Raven Ray is 42 year old status post I&D perirectal abscess a few weeks ago.  She is doing okay felt he is not from perirectal area.  No fevers no chills.  Physical exam: No acute distress.  Awake and alert. There is a very healed wound right gluteal area there is no evidence of necrotizing infection no evidence of further abscess.  This is just some hyper granular scarring.  No open wounds  A/P no issues I do reassure her about benign nature of her complaints.  No evidence of complication or recurrence abscess

## 2023-07-23 NOTE — Progress Notes (Unsigned)
Name: Raven Ray   MRN: [id number]    DOB: 1981-09-09   Date:07/24/2023       Progress Note  Subjective  Chief Complaint  Annual Exam  HPI  Patient presents for annual CPE.  Diet: balanced diet - no longer drinking sodas, eating more fruit , cutting down on bread intake  Exercise: continue regular physical activity  Last Eye Exam: up to date  Last Dental Exam: she has a visit schedule for next month   Flowsheet Row Office Visit from 07/24/2023 in [facility]  AUDIT-C Score 0      Depression: Phq 9 is  negative    07/24/2023    2:05 PM 06/22/2023    1:40 PM 07/21/2022    1:25 PM 07/03/2022   10:50 AM 04/14/2022    2:59 PM  Depression screen PHQ 2/9  Decreased Interest 0 0 0 0 0  Down, Depressed, Hopeless 0 0 0 0 0  PHQ - 2 Score 0 0 0 0 0  Altered sleeping 0  0 0   Tired, decreased energy 0  0 0   Change in appetite 0  0 0   Feeling bad or failure about yourself  0  0 0   Trouble concentrating 0  0 0   Moving slowly or fidgety/restless 0  0 0   Suicidal thoughts 0  0 0   PHQ-9 Score 0  0 0   Difficult doing work/chores    Not difficult at all    Hypertension: BP Readings from Last 3 Encounters:  07/24/23 118/68  07/02/23 121/78  06/22/23 106/66   Obesity: Wt Readings from Last 3 Encounters:  07/24/23 210 lb (95.3 kg)  07/02/23 206 lb 3.2 oz (93.5 kg)  06/22/23 205 lb 9.6 oz (93.3 kg)   BMI Readings from Last 3 Encounters:  07/24/23 37.20 kg/m  07/02/23 36.53 kg/m  06/22/23 35.29 kg/m     Vaccines:   HPV: N/A Tdap: 2014,- discussed with patient  Shingrix: N/A Pneumonia: up to date Flu: today  COVID-19: N/A   Hep C Screening: 12/20/20 STD testing and prevention (HIV/chl/gon/syphilis): 06/08/23 Intimate partner violence: negative screen  Sexual History : one partner for the past year broke up 2 months ago  Menstrual History/LMP/Abnormal Bleeding: cycles are regular, normal flow to heavy flow  Discussed importance of  follow up if any post-menopausal bleeding: N/A  Incontinence Symptoms: negative for symptoms   Breast cancer:  - Last Mammogram: Scheduled for 07/26/23 - BRCA gene screening: N/A  Osteoporosis Prevention : Discussed high calcium and vitamin D supplementation, weight bearing exercises Bone density: N/A   Cervical cancer screening: 07/21/22  Skin cancer: Discussed monitoring for atypical lesions  Colorectal cancer: due in 2025   Lung cancer:  Low Dose CT Chest recommended if Age 66-80 years, 20 pack-year currently smoking OR have quit w/in 15years. Patient does not qualify for screen   ECG: 03/05/18  Advanced Care Planning: A voluntary discussion about advance care planning including the explanation and discussion of advance directives.  Discussed health care proxy and Living will, and the patient was able to identify a health care proxy as daughter .  Patient does not have a living will and power of attorney of health care    Glucose: Glucose, Bld  Date Value Ref Range Status  05/17/2020 82 65 - 99 mg/dL Final    Comment:    .            Fasting  reference interval .   03/04/2018 120 (H) 65 - 99 mg/dL Final    Patient Active Problem List   Diagnosis Date Noted   Vitamin D deficiency 07/21/2022   Morbid obesity (HCC) 210 lbs 03/06/2022   Cyst of buttocks 11/10/2021   Chronic pain of left knee 03/17/2019   Myasthenia gravis (HCC) 10/10/2016   Eczema 10/10/2016    Past Surgical History:  Procedure Laterality Date   CESAREAN SECTION  10/10/1999   TOTAL THYMECTOMY  1991    Family History  Problem Relation Age of Onset   Hypertension Mother    Migraines Mother    Hypertension Father    Diabetes Maternal Grandmother    Breast cancer Neg Hx     Social History   Socioeconomic History   Marital status: Single    Spouse name: Not on file   Number of children: 2   Years of education: some college   Highest education level: Associate degree: occupational, Scientist, product/process development, or  vocational program  Occupational History   Occupation: Disabled  Tobacco Use   Smoking status: Former    Types: Cigarettes    Passive exposure: Past   Smokeless tobacco: Never  Vaping Use   Vaping status: Never Used  Substance and Sexual Activity   Alcohol use: Yes    Alcohol/week: 1.0 standard drink of alcohol    Types: 1 Glasses of wine per week    Comment: occasionally   Drug use: No   Sexual activity: Not Currently    Partners: Male    Birth control/protection: Other-see comments    Comment: Nuva Ring  Other Topics Concern   Not on file  Social History Narrative   She is on disability but she states she went back to work part time to not feel disabled. 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       She completed school to become a para-legal       Social Determinants of Health   Financial Resource Strain: Low Risk  (06/22/2023)   Overall Financial Resource Strain (CARDIA)    Difficulty of Paying Living Expenses: Not hard at all  Food Insecurity: No Food Insecurity (06/22/2023)   Hunger Vital Sign    Worried About Running Out of Food in the Last Year: Never true    Ran Out of Food in the Last Year: Never true  Transportation Needs: No Transportation Needs (06/22/2023)   PRAPARE - Administrator, Civil Service (Medical): No    Lack of Transportation (Non-Medical): No  Physical Activity: Sufficiently Active (07/24/2023)   Exercise Vital Sign    Days of Exercise per Week: 4 days    Minutes of Exercise per Session: 60 min  Recent Concern: Physical Activity - Inactive (06/22/2023)   Exercise Vital Sign    Days of Exercise per Week: 0 days    Minutes of Exercise per Session: 0 min  Stress: No Stress Concern Present (06/22/2023)   [employer] of Occupational Health - Occupational Stress Questionnaire    Feeling of Stress : Not at all  Social Connections: Moderately Integrated (06/22/2023)   Social Connection and Isolation Panel [NHANES]    Frequency of  Communication with Friends and Family: More than three times a week    Frequency of Social Gatherings with Friends and Family: More than three times a week    Attends Religious Services: More than 4 times per year    Active Member of Clubs or Organizations: Yes    Attends  Club or Organization Meetings: 1 to 4 times per year    Marital Status: Never married  Intimate Partner Violence: Not At Risk (06/22/2023)   Humiliation, Afraid, Rape, and Kick questionnaire    Fear of Current or Ex-Partner: No    Emotionally Abused: No    Physically Abused: No    Sexually Abused: No     Current Outpatient Medications:    Cholecalciferol (VITAMIN D) 50 MCG (2000 UT) CAPS, Take 1 Capful by mouth daily at 12 noon., Disp: , Rfl:    etonogestrel-ethinyl estradiol (NUVARING) 0.12-0.015 MG/24HR vaginal ring, Insert vaginally and leave in place for 3 consecutive weeks, then remove for 1 week., Disp: 3 each, Rfl: 4   ketoconazole (NIZORAL) 2 % cream, APPLY 1 APPLICATION TOPICALLY DAILY, Disp: 15 g, Rfl: 1   omeprazole (PRILOSEC) 20 MG capsule, Take by mouth daily. PRN only, Disp: , Rfl: 1   pyridostigmine (MESTINON) 60 MG tablet, Take 1 tablet (60 mg total) by mouth daily as needed., Disp: 360 tablet, Rfl: 1   triamcinolone (KENALOG) 0.025 % ointment, Apply 1 Application topically 2 (two) times daily as needed (rash, itching, raised patches). Do not apply to face, Disp: 15 g, Rfl: 1  No Known Allergies   ROS  Constitutional: Negative for fever or weight change.  Respiratory: Negative for cough and shortness of breath.   Cardiovascular: Negative for chest pain or palpitations.  Gastrointestinal: Negative for abdominal pain, no bowel changes.  Musculoskeletal: Negative for gait problem or joint swelling.  Skin: Negative for rash.  Neurological: Negative for dizziness or headache.  No other specific complaints in a complete review of systems (except as listed in HPI above).   Objective  Vitals:    07/24/23 1405  BP: 118/68  Pulse: 92  Resp: 16  SpO2: 100%  Weight: 210 lb (95.3 kg)  Height: 5\' 3"  (1.6 m)    Body mass index is 37.2 kg/m.  Physical Exam  Constitutional: Patient appears well-developed and well-nourished. No distress.  HENT: Head: Normocephalic and atraumatic. Ears: B TMs ok, no erythema or effusion; Nose: Nose normal. Mouth/Throat: Oropharynx is clear and moist. No oropharyngeal exudate.  Eyes: Conjunctivae and EOM are normal. Pupils are equal, round, and reactive to light. No scleral icterus.  Neck: Normal range of motion. Neck supple. No JVD present. No thyromegaly present.  Cardiovascular: Normal rate, regular rhythm and normal heart sounds.  No murmur heard. No BLE edema. Pulmonary/Chest: Effort normal and breath sounds normal. No respiratory distress. Abdominal: Soft. Bowel sounds are normal, no distension. There is no tenderness. no masses Breast: no lumps or masses, no nipple discharge or rashes FEMALE GENITALIA:  Not done  RECTAL: not done  Musculoskeletal: Normal range of motion, no joint effusions. No gross deformities Neurological: he is alert and oriented to person, place, and time. No cranial nerve deficit. Coordination, balance, strength, speech and gait are normal.  Skin: Skin is warm and dry. Lesion on right arm, growing and itching  Psychiatric: Patient has a normal mood and affect. behavior is normal. Judgment and thought content normal.   Recent Results (from the past 2160 hour(s))  Anaerobic and Aerobic Culture     Status: Abnormal   Collection Time: 06/06/23  2:07 PM   Specimen: Abscess   Abscess Abscess of but  Result Value Ref Range   Anaerobic Culture Final report (A)    Result 1 Comment (A)     Comment: Mixed anaerobic organisms, none predominating. Scant growth    Aerobic  Culture Final report    Result 1 Comment     Comment: No growth in 36 - 48 hours.  [facility] HIV SCREENING LAB     Status: None   Collection Time: 06/08/23 12:00 AM   Result Value Ref Range   HM HIV Screening Negative - Validated   Gonococcus culture     Status: None   Collection Time: 06/08/23 12:01 PM   Specimen: Genital   OL  Result Value Ref Range   GC Culture Only Final report     Comment: Specimen stability note: A swab transport (ie., ESwab, Amies agar gel) received by the lab more than 24 hours after collection may result in reduced recovery of Neisseria gonorrhoeae (GC). (This is informational only and may not apply to this specimen.)    Result 1 Comment     Comment: No Neisseria gonorrhoeae isolated.  WET PREP FOR TRICH, YEAST, CLUE     Status: None   Collection Time: 06/08/23  2:32 PM  Result Value Ref Range   Trichomonas Exam Negative Negative   Yeast Exam MOD Negative   Clue Cell Exam Comment: Negative    Comment: NEG; AMINE NEG     Fall Risk:    07/24/2023    2:04 PM 06/22/2023    1:35 PM 08/17/2022    3:19 PM 07/21/2022    1:21 PM 07/03/2022   10:50 AM  Fall Risk   Falls in the past year? 0 0 0 0 0  Number falls in past yr: 0 0 0 0 0  Injury with Fall? 0 0 0 0 0  Risk for fall due to : No Fall Risks No Fall Risks No Fall Risks No Fall Risks No Fall Risks  Follow up Falls prevention discussed Education provided;Falls prevention discussed Falls evaluation completed Falls prevention discussed Falls prevention discussed;Education provided;Falls evaluation completed     Functional Status Survey: Is the patient deaf or have difficulty hearing?: No Does the patient have difficulty seeing, even when wearing glasses/contacts?: Yes Does the patient have difficulty concentrating, remembering, or making decisions?: No Does the patient have difficulty walking or climbing stairs?: Yes Does the patient have difficulty dressing or bathing?: No Does the patient have difficulty doing errands alone such as visiting a doctor's office or shopping?: No   Assessment & Plan  1. Well adult exam   2. Need for immunization against  influenza  - Flu vaccine trivalent PF, 6mos and older(Flulaval,Afluria,Fluarix,Fluzone)  3. Skin lesion of right arm  Discussed importance of returning for a biopsy'  4. Screening for STD (sexually transmitted disease)  - RPR - HIV Antibody (routine testing w rflx) - Cervicovaginal ancillary only  5. Vitamin D deficiency  - VITAMIN D 25 Hydroxy (Vit-D Deficiency, Fractures)  6. Hyperglycemia  - Hemoglobin A1c  7. Myasthenia gravis (HCC)  - CBC with Differential/Platelet - COMPLETE METABOLIC PANEL WITH GFR - TSH  She needs to go back to Dr. Sherryll Burger   8. Hypercholesteremia  - Lipid panel    -USPSTF grade A and B recommendations reviewed with patient; age-appropriate recommendations, preventive care, screening tests, etc discussed and encouraged; healthy living encouraged; see AVS for patient education given to patient -Discussed importance of 150 minutes of physical activity weekly, eat two servings of fish weekly, eat one serving of tree nuts ( cashews, pistachios, pecans, almonds.Marland Kitchen) every other day, eat 6 servings of fruit/vegetables daily and drink plenty of water and avoid sweet beverages.   -Reviewed Health Maintenance: Yes.

## 2023-07-24 ENCOUNTER — Encounter: Payer: Self-pay | Admitting: Family Medicine

## 2023-07-24 ENCOUNTER — Other Ambulatory Visit (HOSPITAL_COMMUNITY)
Admission: RE | Admit: 2023-07-24 | Discharge: 2023-07-24 | Disposition: A | Discharge status: Home / Self Care | Payer: 59 | Source: Ambulatory Visit | Attending: Family Medicine | Admitting: Family Medicine

## 2023-07-24 ENCOUNTER — Ambulatory Visit (INDEPENDENT_AMBULATORY_CARE_PROVIDER_SITE_OTHER): Payer: 59 | Admitting: Family Medicine

## 2023-07-24 VITALS — BP 118/68 | HR 92 | Resp 16 | Ht 63.0 in | Wt 210.0 lb

## 2023-07-24 DIAGNOSIS — R739 Hyperglycemia, unspecified: Secondary | ICD-10-CM | POA: Diagnosis not present

## 2023-07-24 DIAGNOSIS — Z0000 Encounter for general adult medical examination without abnormal findings: Secondary | ICD-10-CM | POA: Diagnosis not present

## 2023-07-24 DIAGNOSIS — E559 Vitamin D deficiency, unspecified: Secondary | ICD-10-CM | POA: Diagnosis not present

## 2023-07-24 DIAGNOSIS — Z23 Encounter for immunization: Secondary | ICD-10-CM

## 2023-07-24 DIAGNOSIS — E7800 Pure hypercholesterolemia, unspecified: Secondary | ICD-10-CM | POA: Diagnosis not present

## 2023-07-24 DIAGNOSIS — G7000 Myasthenia gravis without (acute) exacerbation: Secondary | ICD-10-CM | POA: Diagnosis not present

## 2023-07-24 DIAGNOSIS — L989 Disorder of the skin and subcutaneous tissue, unspecified: Secondary | ICD-10-CM

## 2023-07-24 DIAGNOSIS — Z113 Encounter for screening for infections with a predominantly sexual mode of transmission: Secondary | ICD-10-CM | POA: Insufficient documentation

## 2023-07-24 NOTE — Patient Instructions (Signed)
You need Tdap  

## 2023-07-25 LAB — VITAMIN D 25 HYDROXY (VIT D DEFICIENCY, FRACTURES): Vit D, 25-Hydroxy: 28 ng/mL — ABNORMAL LOW (ref 30–100)

## 2023-07-25 LAB — CBC WITH DIFFERENTIAL/PLATELET
Absolute Lymphocytes: 2288 {cells}/uL (ref 850–3900)
Absolute Monocytes: 353 {cells}/uL (ref 200–950)
Basophils Absolute: 29 {cells}/uL (ref 0–200)
Basophils Relative: 0.6 %
Eosinophils Absolute: 221 {cells}/uL (ref 15–500)
Eosinophils Relative: 4.5 %
HCT: 39 % (ref 35.0–45.0)
Hemoglobin: 12.8 g/dL (ref 11.7–15.5)
MCH: 29.2 pg (ref 27.0–33.0)
MCHC: 32.8 g/dL (ref 32.0–36.0)
MCV: 88.8 fL (ref 80.0–100.0)
MPV: 10 fL (ref 7.5–12.5)
Monocytes Relative: 7.2 %
Neutro Abs: 2009 {cells}/uL (ref 1500–7800)
Neutrophils Relative %: 41 %
Platelets: 215 10*3/uL (ref 140–400)
RBC: 4.39 10*6/uL (ref 3.80–5.10)
RDW: 12.4 % (ref 11.0–15.0)
Total Lymphocyte: 46.7 %
WBC: 4.9 10*3/uL (ref 3.8–10.8)

## 2023-07-25 LAB — LIPID PANEL
Cholesterol: 226 mg/dL — ABNORMAL HIGH (ref <200)
HDL: 82 mg/dL (ref >=50)
LDL Cholesterol (Calc): 128 mg/dL — ABNORMAL HIGH
Non-HDL Cholesterol (Calc): 144 mg/dL — ABNORMAL HIGH (ref <130)
Total CHOL/HDL Ratio: 2.8 (calc) (ref <5.0)
Triglycerides: 64 mg/dL (ref <150)

## 2023-07-25 LAB — COMPLETE METABOLIC PANEL WITH GFR
AG Ratio: 1.4 (calc) (ref 1.0–2.5)
ALT: 10 U/L (ref 6–29)
AST: 17 U/L (ref 10–30)
Albumin: 4.2 g/dL (ref 3.6–5.1)
Alkaline phosphatase (APISO): 60 U/L (ref 31–125)
BUN: 14 mg/dL (ref 7–25)
CO2: 25 mmol/L (ref 20–32)
Calcium: 9 mg/dL (ref 8.6–10.2)
Chloride: 105 mmol/L (ref 98–110)
Creat: 0.68 mg/dL (ref 0.50–0.99)
Globulin: 3.1 g/dL (ref 1.9–3.7)
Glucose, Bld: 85 mg/dL (ref 65–99)
Potassium: 3.9 mmol/L (ref 3.5–5.3)
Sodium: 138 mmol/L (ref 135–146)
Total Bilirubin: 0.4 mg/dL (ref 0.2–1.2)
Total Protein: 7.3 g/dL (ref 6.1–8.1)
eGFR: 111 mL/min/{1.73_m2} (ref >=60)

## 2023-07-25 LAB — HIV ANTIBODY (ROUTINE TESTING W REFLEX): HIV 1&2 Ab, 4th Generation: NONREACTIVE

## 2023-07-25 LAB — RPR: RPR Ser Ql: NONREACTIVE

## 2023-07-25 LAB — HEMOGLOBIN A1C
Hgb A1c MFr Bld: 5.5 %{Hb} (ref <5.7)
Mean Plasma Glucose: 111 mg/dL
eAG (mmol/L): 6.2 mmol/L

## 2023-07-25 LAB — TSH: TSH: 0.8 m[IU]/L

## 2023-07-26 ENCOUNTER — Ambulatory Visit
Admission: RE | Admit: 2023-07-26 | Discharge: 2023-07-26 | Disposition: A | Discharge status: Home / Self Care | Payer: 59 | Source: Ambulatory Visit | Attending: Family Medicine | Admitting: Family Medicine

## 2023-07-26 DIAGNOSIS — Z1231 Encounter for screening mammogram for malignant neoplasm of breast: Secondary | ICD-10-CM

## 2023-07-26 LAB — CERVICOVAGINAL ANCILLARY ONLY
Chlamydia: NEGATIVE
Comment: NEGATIVE
Comment: NORMAL
Neisseria Gonorrhea: NEGATIVE

## 2023-08-01 NOTE — Progress Notes (Addendum)
Name: Raven Ray   MRN: [id number]    DOB: Jun 27, 1981   Date:08/02/2023       Progress Note  Subjective  Chief Complaint  Biopsy  HPI  Right arm lesion: going on for years but growing and itching now, she returned today for a shave biopsy  Patient Active Problem List   Diagnosis Date Noted   Vitamin D deficiency 07/21/2022   Morbid obesity (HCC) 210 lbs 03/06/2022   Cyst of buttocks 11/10/2021   Chronic pain of left knee 03/17/2019   Myasthenia gravis (HCC) 10/10/2016   Eczema 10/10/2016    Past Surgical History:  Procedure Laterality Date   CESAREAN SECTION  10/10/1999   TOTAL THYMECTOMY  1991    Family History  Problem Relation Age of Onset   Hypertension Mother    Migraines Mother    Hypertension Father    Diabetes Maternal Grandmother    Breast cancer Neg Hx     Social History   Tobacco Use   Smoking status: Former    Types: Cigarettes    Passive exposure: Past   Smokeless tobacco: Never  Substance Use Topics   Alcohol use: Yes    Alcohol/week: 1.0 standard drink of alcohol    Types: 1 Glasses of wine per week    Comment: occasionally     Current Outpatient Medications:    Cholecalciferol (VITAMIN D) 50 MCG (2000 UT) CAPS, Take 1 Capful by mouth daily at 12 noon., Disp: , Rfl:    etonogestrel-ethinyl estradiol (NUVARING) 0.12-0.015 MG/24HR vaginal ring, Insert vaginally and leave in place for 3 consecutive weeks, then remove for 1 week., Disp: 3 each, Rfl: 4   ketoconazole (NIZORAL) 2 % cream, APPLY 1 APPLICATION TOPICALLY DAILY, Disp: 15 g, Rfl: 1   omeprazole (PRILOSEC) 20 MG capsule, Take by mouth daily. PRN only, Disp: , Rfl: 1   pyridostigmine (MESTINON) 60 MG tablet, Take 1 tablet (60 mg total) by mouth daily as needed., Disp: 360 tablet, Rfl: 1   triamcinolone (KENALOG) 0.025 % ointment, Apply 1 Application topically 2 (two) times daily as needed (rash, itching, raised patches). Do not apply to face, Disp: 15 g, Rfl: 1  No Known  Allergies  I personally reviewed active problem list, medication list, allergies, family history, social history, health maintenance with the patient/caregiver today.   ROS  Ten systems reviewed and is negative except as mentioned in HPI    Objective  Vitals:   08/02/23 1356  BP: 120/82  Pulse: 66  Resp: 16  SpO2: 98%  Weight: 210 lb (95.3 kg)  Height: 5\' 3"  (1.6 m)    Body mass index is 37.2 kg/m.  Physical Exam  Constitutional: Patient appears well-developed and well-nourished. Obese  No distress.  HEENT: head atraumatic, normocephalic, pupils equal and reactive to light, neck supple Cardiovascular: Normal rate, regular rhythm and normal heart sounds.  No murmur heard. No BLE edema. Pulmonary/Chest: Effort normal and breath sounds normal. No respiratory distress. Skin: papule, dark / almost black in color, wart like on right forearm, irregular borders Abdominal: Soft.  There is no tenderness. Psychiatric: Patient has a normal mood and affect. behavior is normal. Judgment and thought content normal.    PHQ2/9:    08/02/2023    1:55 PM 07/24/2023    2:05 PM 06/22/2023    1:40 PM 07/21/2022    1:25 PM 07/03/2022   10:50 AM  Depression screen PHQ 2/9  Decreased Interest 0 0 0 0 0  Down, Depressed, Hopeless  0 0 0 0 0  PHQ - 2 Score 0 0 0 0 0  Altered sleeping 0 0  0 0  Tired, decreased energy 0 0  0 0  Change in appetite 0 0  0 0  Feeling bad or failure about yourself  0 0  0 0  Trouble concentrating 0 0  0 0  Moving slowly or fidgety/restless 0 0  0 0  Suicidal thoughts 0 0  0 0  PHQ-9 Score 0 0  0 0  Difficult doing work/chores     Not difficult at all    phq 9 is negative   Fall Risk:    08/02/2023    1:55 PM 07/24/2023    2:04 PM 06/22/2023    1:35 PM 08/17/2022    3:19 PM 07/21/2022    1:21 PM  Fall Risk   Falls in the past year? 0 0 0 0 0  Number falls in past yr: 0 0 0 0 0  Injury with Fall? 0 0 0 0 0  Risk for fall due to : No Fall Risks No  Fall Risks No Fall Risks No Fall Risks No Fall Risks  Follow up Falls prevention discussed Falls prevention discussed Education provided;Falls prevention discussed Falls evaluation completed Falls prevention discussed      Functional Status Survey: Is the patient deaf or have difficulty hearing?: No Does the patient have difficulty seeing, even when wearing glasses/contacts?: Yes Does the patient have difficulty concentrating, remembering, or making decisions?: No Does the patient have difficulty walking or climbing stairs?: Yes Does the patient have difficulty dressing or bathing?: No Does the patient have difficulty doing errands alone such as visiting a doctor's office or shopping?: No    Assessment & Plan  1. Skin lesion of right arm   Consent signed: YES  Procedure: Skin Mass Removal Location: right forearm   Equipment used: derma-blade, high temperature cautery Anesthesia: 1% Lidocaine w/o Epinephrine  Cleaned and prepped: alcohol   After consent signed, are of skin prepped with betadine. Lidocaine w/o epinephrine injected into skin underneath skin mass. After properly numbed sterile equipment used to remove tag.  Specimen sent for pathology analysis. Instructed on proper care to allow for proper healing. F/U for nursing visit if needed.   - Surgical pathology

## 2023-08-02 ENCOUNTER — Ambulatory Visit (INDEPENDENT_AMBULATORY_CARE_PROVIDER_SITE_OTHER): Payer: 59 | Admitting: Family Medicine

## 2023-08-02 ENCOUNTER — Encounter: Payer: Self-pay | Admitting: Family Medicine

## 2023-08-02 ENCOUNTER — Other Ambulatory Visit (HOSPITAL_COMMUNITY)
Admission: RE | Admit: 2023-08-02 | Discharge: 2023-08-02 | Disposition: A | Discharge status: Home / Self Care | Payer: 59 | Source: Ambulatory Visit | Attending: Family Medicine | Admitting: Family Medicine

## 2023-08-02 VITALS — BP 120/82 | HR 66 | Resp 16 | Ht 63.0 in | Wt 210.0 lb

## 2023-08-02 DIAGNOSIS — L989 Disorder of the skin and subcutaneous tissue, unspecified: Secondary | ICD-10-CM | POA: Insufficient documentation

## 2023-08-02 DIAGNOSIS — D485 Neoplasm of uncertain behavior of skin: Secondary | ICD-10-CM

## 2023-08-07 LAB — SURGICAL PATHOLOGY

## 2023-08-15 DIAGNOSIS — H50811 Duane's syndrome, right eye: Secondary | ICD-10-CM | POA: Diagnosis not present

## 2023-08-15 DIAGNOSIS — G7000 Myasthenia gravis without (acute) exacerbation: Secondary | ICD-10-CM | POA: Diagnosis not present

## 2023-08-15 DIAGNOSIS — H532 Diplopia: Secondary | ICD-10-CM | POA: Diagnosis not present

## 2023-08-15 DIAGNOSIS — H5030 Unspecified intermittent heterotropia: Secondary | ICD-10-CM | POA: Diagnosis not present

## 2023-08-27 ENCOUNTER — Other Ambulatory Visit: Payer: Self-pay | Admitting: Family Medicine

## 2023-08-27 DIAGNOSIS — Z3049 Encounter for surveillance of other contraceptives: Secondary | ICD-10-CM

## 2023-08-27 DIAGNOSIS — Z3009 Encounter for other general counseling and advice on contraception: Secondary | ICD-10-CM

## 2023-08-28 ENCOUNTER — Other Ambulatory Visit: Payer: Self-pay

## 2023-08-28 DIAGNOSIS — Z3049 Encounter for surveillance of other contraceptives: Secondary | ICD-10-CM

## 2023-08-28 DIAGNOSIS — Z3009 Encounter for other general counseling and advice on contraception: Secondary | ICD-10-CM

## 2023-08-28 MED ORDER — ETONOGESTREL-ETHINYL ESTRADIOL 0.12-0.015 MG/24HR VA RING: VAGINAL_RING | VAGINAL | 4 refills | Status: DC

## 2024-01-14 ENCOUNTER — Ambulatory Visit: Admitting: Family Medicine

## 2024-01-14 DIAGNOSIS — Z113 Encounter for screening for infections with a predominantly sexual mode of transmission: Secondary | ICD-10-CM

## 2024-01-14 DIAGNOSIS — B9689 Other specified bacterial agents as the cause of diseases classified elsewhere: Secondary | ICD-10-CM

## 2024-01-14 LAB — HM HIV SCREENING LAB: HM HIV Screening: NEGATIVE

## 2024-01-14 LAB — WET PREP FOR TRICH, YEAST, CLUE
Trichomonas Exam: NEGATIVE
Yeast Exam: NEGATIVE

## 2024-01-14 MED ORDER — METRONIDAZOLE 500 MG PO TABS: 500.0000 mg | ORAL_TABLET | Freq: Two times a day (BID) | ORAL | 0 refills | Status: AC

## 2024-01-14 NOTE — Patient Instructions (Signed)
 STI screening - Today we obtained a vaginal swab to screen for gonorrhea, chlamydia, and trichomonas - We also obtained a blood sample to screen for HIV and syphilis - If the results are abnormal, I will give you a call.    Estimated time frame for results collected at the [facility] Department: Same day Trichomonas Yeast BV (bacterial vaginosis)  Within 1-2 weeks Gonorrhea Chlamydia  Within 2-3 weeks HIV Syphilis Hepatitis B Hepatitis C

## 2024-01-14 NOTE — Addendum Note (Signed)
 Addended by: Kerry Chisolm M on: 01/14/2024 03:45 PM   Modules accepted: Orders

## 2024-01-14 NOTE — Progress Notes (Signed)
 [facility] Department STI clinic [address] [place] [address] Main phone: [phone]  STI screening visit  Subjective:  Raven Ray is a 43 y.o. female being seen today for an STI screening visit. The patient reports they do not have symptoms.  Patient reports that they do not desire a pregnancy in the next year.   They reported they are not interested in discussing contraception today - happy with Nuvaring contraception.   Patient's last menstrual period was 12/27/2023.  Patient has the following medical conditions:  Patient Active Problem List   Diagnosis Date Noted   Vitamin D deficiency 07/21/2022   Morbid obesity (HCC) 210 lbs 03/06/2022   Cyst of buttocks 11/10/2021   Chronic pain of left knee 03/17/2019   Myasthenia gravis (HCC) 10/10/2016   Eczema 10/10/2016   Chief Complaint  Patient presents with   SEXUALLY TRANSMITTED DISEASE    Pt is here STD screening and has no symptoms   HPI Patient reports she would like STI screening. Reports slight pruritus x 1-2 weeks.   Does the patient using douching products? No  Last HIV test per patient/review of record was  Lab Results  Component Value Date   HMHIVSCREEN Negative - Validated 06/08/2023    Lab Results  Component Value Date   HIV NON-REACTIVE 07/24/2023    Last HEPC test per patient/review of record was  Lab Results  Component Value Date   HMHEPCSCREEN Negative-Validated 12/20/2020   No components found for: "HEPC"   Last HEPB test per patient/review of record was No components found for: "HMHEPBSCREEN"   Patient reports last pap was:      Component Value Date/Time   DIAGPAP  07/21/2022 1358    - Negative for intraepithelial lesion or malignancy (NILM)   HPVHIGH Negative 07/21/2022 1358   ADEQPAP  07/21/2022 1358    Satisfactory for evaluation; transformation zone component PRESENT.   No results found for: "SPECADGYN" Result Date Procedure Results  Follow-ups  07/21/2022 Cytology - PAP High risk HPV: Negative Neisseria Gonorrhea: Negative Chlamydia: Negative Adequacy: Satisfactory for evaluation; transformation zone component PRESENT. Diagnosis: - Negative for intraepithelial lesion or malignancy (NILM) Microorganisms: Fungal organisms present consistent with Candida spp. Comment: Normal Reference Range HPV - Negative Comment: Normal Reference Ranger Chlamydia - Negative Comment: Normal Reference Range Neisseria Gonorrhea - Negative   10/03/2017 HM PAP SMEAR HM Pap smear: Normal Pap and Negative HPV    Screening for MPX risk: Does the patient have an unexplained rash? No Is the patient MSM? No Does the patient endorse multiple sex partners or anonymous sex partners? No Did the patient have close or sexual contact with a person diagnosed with MPX? No Has the patient traveled outside the [place] where MPX is endemic? No Is there a high clinical suspicion for MPX-- evidenced by one of the following No  -Unlikely to be chickenpox  -Lymphadenopathy  -Rash that present in same phase of evolution on any given body part See flowsheet for further details and programmatic requirements.   Immunization history:  Immunization History  Administered Date(s) Administered   Hep A / Hep B 05/03/2006, 06/07/2006, 12/13/2010   Influenza, Seasonal, Injecte, Preservative Fre 07/24/2023   Influenza,inj,Quad PF,6+ Mos 06/14/2017, 07/21/2022   PNEUMOCOCCAL CONJUGATE-20 07/21/2022   Tdap 06/10/2013    The following portions of the patient's history were reviewed and updated as appropriate: allergies, current medications, past medical history, past social history, past surgical history and problem list.  Objective:  There  were no vitals filed for this visit.  Physical Exam Vitals and nursing note reviewed. Exam conducted with a chaperone present Stage manager).  Constitutional:      Appearance: Normal appearance.  HENT:     Head:  Normocephalic and atraumatic.     Mouth/Throat:     Mouth: Mucous membranes are moist.     Pharynx: Oropharynx is clear. No oropharyngeal exudate or posterior oropharyngeal erythema.  Eyes:     General: No scleral icterus.       Right eye: No discharge.        Left eye: No discharge.     Conjunctiva/sclera: Conjunctivae normal.  Pulmonary:     Effort: Pulmonary effort is normal.  Genitourinary:    General: Normal vulva.     Exam position: Lithotomy position.     Pubic Area: No rash or pubic lice.      Tanner stage (genital): 5.     Labia:        Right: No rash or lesion.        Left: No rash or lesion.      Vagina: Normal. No vaginal discharge, erythema, bleeding or lesions.     Cervix: Normal. No cervical motion tenderness, discharge, friability, lesion or erythema.     Uterus: Normal.   Musculoskeletal:        General: Normal range of motion.     Cervical back: Neck supple. No rigidity or tenderness.  Lymphadenopathy:     Head:     Right side of head: No preauricular or posterior auricular adenopathy.     Left side of head: No preauricular or posterior auricular adenopathy.     Cervical: No cervical adenopathy.     Right cervical: No superficial or posterior cervical adenopathy.    Left cervical: No superficial or posterior cervical adenopathy.     Upper Body:     Right upper body: No supraclavicular adenopathy.     Left upper body: No supraclavicular adenopathy.  Skin:    General: Skin is warm and dry.     Capillary Refill: Capillary refill takes less than 2 seconds.     Coloration: Skin is not jaundiced.     Findings: No bruising, lesion or rash.  Neurological:     General: No focal deficit present.     Mental Status: She is alert and oriented to person, place, and time.  Psychiatric:        Mood and Affect: Mood normal.        Behavior: Behavior normal.    Assessment and Plan:  Raven Ray is a 43 y.o. female presenting to the [facility]  Department for STI screening  Screening examination for venereal disease -     Gonococcus culture -     WET PREP FOR TRICH, YEAST, CLUE -     HIV [place] [facility] -     Syphilis Serology, [place] [facility] -     Chlamydia/Gonorrhea [place] [facility]  Patient accepted all screenings including oral, vaginal CT/GC and bloodwork for HIV/RPR, and wet prep. Patient meets criteria for HepB screening? No. Ordered? not applicable Patient meets criteria for HepC screening? No. Ordered? not applicable  Treat wet prep per standing order Discussed time line for State Lab results and that patient will be called with positive results and encouraged patient to call if she had not heard in 2 weeks.  Counseled to return or seek care for continued or worsening symptoms Recommended repeat  testing in 3 months with positive results. Recommended condom use with all sex for STI prevention.   Patient is currently using Hormonal Contraception: Injection, Rings and Patches to prevent pregnancy.    No follow-ups on file.  Future Appointments  Date Time Provider Department Center  01/23/2024  3:20 PM Arleen Lacer, MD [facility]  06/23/2024  2:20 PM [facility]NUAL WELLNESS VISIT [facility]   Jack Marts, MD

## 2024-01-14 NOTE — Progress Notes (Signed)
 Pt is here for STD screening. The patient was dispensed Metronidazole 500 mg 2x/day for 7 days today. I provided counseling today regarding the medication. We discussed the medication, the side effects and when to call clinic. Patient given the opportunity to ask questions for any clarification. Condoms declined, brochure given.Austine Lefort, RN.

## 2024-01-19 LAB — GONOCOCCUS CULTURE

## 2024-01-23 ENCOUNTER — Ambulatory Visit: Payer: Self-pay | Admitting: Family Medicine

## 2024-02-06 ENCOUNTER — Ambulatory Visit: Payer: 59 | Admitting: Family Medicine

## 2024-04-07 ENCOUNTER — Ambulatory Visit

## 2024-04-14 ENCOUNTER — Ambulatory Visit: Admitting: Family Medicine

## 2024-04-14 ENCOUNTER — Encounter: Payer: Self-pay | Admitting: Family Medicine

## 2024-04-14 DIAGNOSIS — Z113 Encounter for screening for infections with a predominantly sexual mode of transmission: Secondary | ICD-10-CM | POA: Diagnosis not present

## 2024-04-14 DIAGNOSIS — A599 Trichomoniasis, unspecified: Secondary | ICD-10-CM

## 2024-04-14 LAB — HM HIV SCREENING LAB: HM HIV Screening: NEGATIVE

## 2024-04-14 LAB — WET PREP FOR TRICH, YEAST, CLUE
Clue Cell Exam: NEGATIVE
Trichomonas Exam: POSITIVE — AB
Yeast Exam: NEGATIVE

## 2024-04-14 MED ORDER — METRONIDAZOLE 500 MG PO TABS: 500.0000 mg | ORAL_TABLET | Freq: Two times a day (BID) | ORAL | Status: AC

## 2024-04-14 NOTE — Progress Notes (Unsigned)
 [facility] Department STI clinic [address] [place] [address] Main phone: [phone]  STI screening visit  Subjective:  JANIENE AARONS is a 43 y.o. female being seen today for an STI screening visit. The patient reports they do have symptoms.  Patient reports that they do not desire a pregnancy in the next year.   They reported they are not interested in discussing contraception today.    Patient's last menstrual period was 03/02/2024 (exact date).  Patient has the following medical conditions:  Patient Active Problem List   Diagnosis Date Noted   Vitamin D  deficiency 07/21/2022   Morbid obesity (HCC) 210 lbs 03/06/2022   Cyst of buttocks 11/10/2021   Chronic pain of left knee 03/17/2019   Myasthenia gravis (HCC) 10/10/2016   Eczema 10/10/2016   Chief Complaint  Patient presents with   SEXUALLY TRANSMITTED DISEASE    HPI Patient reports discharge, vaginal/vulvar itching, irritation. Last sex 3 weeks ago. Has decided she would like to now be celibate and wants STI testing.   LMP was 03/02/24 but patient uses her Nuvaring continuously/skips her ring-free week. Has a hormone free week every 3 months. Denies desire for a pregnancy test.  Does the patient using douching products? No  See flowsheet for further details and programmatic requirements Hyperlink available at the top of the signed note in blue.  Flow sheet content below:  Pregnancy Intention Screening Does the patient want to become pregnant in the next year?: No Does the patient's partner want to become pregnant in the next year?: No Would the patient like to discuss contraceptive options today?: No Counseling Patient counseled to use condoms with all sex: Condoms declined RTC in 2-3 weeks for test results: Yes Clinic will call if test results abnormal before test result appt.: Yes Test results given to patient Patient counseled to use condoms with all sex: Condoms  declined   Screening for MPX risk: Does the patient have an unexplained rash? No Is the patient MSM? No Does the patient endorse multiple sex partners or anonymous sex partners? No Did the patient have close or sexual contact with a person diagnosed with MPX? No Has the patient traveled outside the [place]  where MPX is endemic? No Is there a high clinical suspicion for MPX-- evidenced by one of the following No  -Unlikely to be chickenpox  -Lymphadenopathy  -Rash that present in same phase of evolution on any given body part  Screenings: Last HIV test per patient/review of record was  Lab Results  Component Value Date   HMHIVSCREEN Negative - Validated 01/14/2024    Lab Results  Component Value Date   HIV NON-REACTIVE 07/24/2023     Last HEPC test per patient/review of record was  Lab Results  Component Value Date   HMHEPCSCREEN Negative-Validated 12/20/2020   No components found for: HEPC   Last HEPB test per patient/review of record was No components found for: HMHEPBSCREEN   Patient reports last pap was:   No results found for: SPECADGYN Result Date Procedure Results Follow-ups  07/21/2022 Cytology - PAP High risk HPV: Negative Neisseria Gonorrhea: Negative Chlamydia: Negative Adequacy: Satisfactory for evaluation; transformation zone component PRESENT. Diagnosis: - Negative for intraepithelial lesion or malignancy (NILM) Microorganisms: Fungal organisms present consistent with Candida spp. Comment: Normal Reference Range HPV - Negative Comment: Normal Reference Ranger Chlamydia - Negative Comment: Normal Reference Range Neisseria Gonorrhea - Negative   10/03/2017 HM PAP SMEAR HM Pap smear: Normal Pap and Negative HPV  Immunization history:  Immunization History  Administered Date(s) Administered   Dtap, Unspecified 09/10/1980, 11/05/1980, 02/01/1981, 02/09/1982, 03/06/1986   Hep A / Hep B 05/03/2006, 06/07/2006, 12/13/2010   Influenza, Seasonal, Injecte,  Preservative Fre 07/24/2023   Influenza,inj,Quad PF,6+ Mos 06/14/2017, 07/21/2022   MMR 10/28/1981, 03/06/1986   PNEUMOCOCCAL CONJUGATE-20 07/21/2022   Polio, Unspecified 09/10/1980, 11/05/1980, 02/01/1981, 02/09/1982, 03/06/1986   Tdap 04/25/2010, 06/10/2013    The following portions of the patient's history were reviewed and updated as appropriate: allergies, current medications, past medical history, past social history, past surgical history and problem list.  Objective:  There were no vitals filed for this visit.  Physical Exam Vitals and nursing note reviewed. Exam conducted with a chaperone present Psychiatric nurse).  Constitutional:      Appearance: Normal appearance.  HENT:     Head: Normocephalic and atraumatic.     Mouth/Throat:     Mouth: Mucous membranes are moist.     Pharynx: Oropharynx is clear. No oropharyngeal exudate or posterior oropharyngeal erythema.  Pulmonary:     Effort: Pulmonary effort is normal.  Genitourinary:    General: Normal vulva.     Exam position: Lithotomy position.     Pubic Area: No rash or pubic lice.      Labia:        Right: No rash or lesion.        Left: No rash or lesion.      Vagina: Vaginal discharge present. No erythema, bleeding or lesions.     Cervix: Discharge and erythema present. No cervical motion tenderness, friability or lesion.     Comments: pH = 4.5 Cervical petechiae/strawberry cervix Copious green, watery discharge Lymphadenopathy:     Cervical: No cervical adenopathy.     Upper Body:     Right upper body: No supraclavicular adenopathy.     Left upper body: No supraclavicular adenopathy.  Skin:    General: Skin is warm and dry.     Findings: No rash.  Neurological:     Mental Status: She is alert and oriented to person, place, and time.      Assessment and Plan:  NAYSA PUSKAS is a 43 y.o. female presenting to the [facility] Department for STI screening  1. Screening examination for  venereal disease (Primary)  - Chlamydia/Gonorrhea [place] [facility] - HIV [place] [facility] - Syphilis Serology, [place] [facility] - WET PREP FOR TRICH, YEAST, CLUE - Gonococcus culture  2. Trichomonas infection  -Exam consistent with trichomonas infection: discharge and cervical petechiae  - metroNIDAZOLE  (FLAGYL ) 500 MG tablet; Take 1 tablet (500 mg total) by mouth 2 (two) times daily for 7 days.   Patient accepted the following screenings: oral GC culture, vaginal CT/GC swab, vaginal wet prep, HIV, and RPR Patient meets criteria for HepB screening? No. Ordered? no Patient meets criteria for HepC screening? No. Ordered? no  Treat wet prep per standing order Discussed time line for State Lab results and that patient will be called with positive results and encouraged patient to call if she had not heard in 2 weeks.  Counseled to return or seek care for continued or worsening symptoms Recommended repeat testing in 3 months with positive results. Recommended condom use with all sex for STI prevention.   Patient is currently using *Nuvaring to prevent pregnancy.    No follow-ups on file.  Future Appointments  Date Time Provider Department Center  05/01/2024  9:20 AM Glenard Mire, MD [facility]  06/27/2024  8:50 AM [facility]ANNUAL WELLNESS VISIT [facility]    Damien Satchel [facility]

## 2024-04-14 NOTE — Progress Notes (Unsigned)
 Patient here for STD screening. Condoms declined. Wet prep reviewed; patient given Metronidazole  500mg  #14 BID x 7days per order by FORBES Satchel, NP/C Macario, MD. I provided counseling today regarding the medication. We discussed the medication, the side effects and when to call clinic. All questions answered and verbalizes understanding.   [name], RN

## 2024-04-19 LAB — GONOCOCCUS CULTURE

## 2024-04-25 ENCOUNTER — Encounter: Payer: Self-pay | Admitting: Family Medicine

## 2024-05-01 ENCOUNTER — Other Ambulatory Visit: Payer: Self-pay | Admitting: Family Medicine

## 2024-05-01 ENCOUNTER — Ambulatory Visit (INDEPENDENT_AMBULATORY_CARE_PROVIDER_SITE_OTHER): Admitting: Family Medicine

## 2024-05-01 VITALS — BP 124/80 | HR 93 | Resp 16 | Ht 63.0 in | Wt 196.2 lb

## 2024-05-01 DIAGNOSIS — L304 Erythema intertrigo: Secondary | ICD-10-CM | POA: Diagnosis not present

## 2024-05-01 DIAGNOSIS — G7000 Myasthenia gravis without (acute) exacerbation: Secondary | ICD-10-CM

## 2024-05-01 DIAGNOSIS — L308 Other specified dermatitis: Secondary | ICD-10-CM | POA: Diagnosis not present

## 2024-05-01 DIAGNOSIS — E66811 Obesity, class 1: Secondary | ICD-10-CM | POA: Diagnosis not present

## 2024-05-01 DIAGNOSIS — Z23 Encounter for immunization: Secondary | ICD-10-CM | POA: Diagnosis not present

## 2024-05-01 DIAGNOSIS — E559 Vitamin D deficiency, unspecified: Secondary | ICD-10-CM

## 2024-05-01 DIAGNOSIS — E7800 Pure hypercholesterolemia, unspecified: Secondary | ICD-10-CM | POA: Diagnosis not present

## 2024-05-01 MED ORDER — KETOCONAZOLE 2 % EX CREA: 1.0000 | TOPICAL_CREAM | Freq: Every day | CUTANEOUS | 1 refills | Status: AC

## 2024-05-01 MED ORDER — DESONIDE 0.05 % EX CREA: TOPICAL_CREAM | Freq: Two times a day (BID) | CUTANEOUS | 2 refills | Status: DC

## 2024-05-01 NOTE — Progress Notes (Addendum)
 Name: Raven Ray   MRN: [id number]    DOB: Apr 23, 1981   Date:05/01/2024       Progress Note  Subjective  Chief Complaint  Chief Complaint  Patient presents with   Medical Management of Chronic Issues   Discussed the use of AI scribe software for clinical note transcription with the patient, who gave verbal consent to proceed.  History of Present Illness Raven Ray is a 43 year old female with myasthenia gravis who presents for a follow-up visit.  She has experienced a weight reduction from 210 pounds in October of last year to 196.2 pounds currently. This weight loss is attributed to increased physical activity, including walking on trails with her daughter approximately four times a week and gardening. She walks until she is out of breath due to her myasthenia gravis, averaging about 30 minutes of activity per day. Additionally, she has modified her diet, reducing soda intake and incorporating more vegetarian meals prepared by her daughter.  She has a history of myasthenia gravis and has not been under regular care for this condition recently. She mentions a previous consultation with Dr. Maree at [facility] in 2019 and has not seen a specialist since. She has not been taking her prescribed medication consistently due to a lapse in refills. She states Dr. Maree recommended her to have another thymus removal  surgery but expresses reluctance to undergo another procedure.  She experiences sensitive skin and eczema, for which she uses desonide  cream. She applies it to affected areas such as her face, sides, and under the breasts. She requests a refill of this medication and discusses the appropriate use of triamcinolone  cream for different body areas.  She uses NuvaRing for contraception and confirms she is currently using it as prescribed. She also takes vitamin D  daily and occasionally uses ketoconazole  cream for antifungal purposes under her breasts. She reports no longer needing  omeprazole  for heartburn since losing weight and improving her diet.  No chest pain, palpitations, nausea, or vomiting. She reports feeling tired, which she attributes to her myasthenia gravis.    Patient Active Problem List   Diagnosis Date Noted   Vitamin D  deficiency 07/21/2022   Chronic pain of left knee 03/17/2019   Myasthenia gravis (HCC) 10/10/2016   Eczema 10/10/2016    Past Surgical History:  Procedure Laterality Date   CESAREAN SECTION  10/10/1999   TOTAL THYMECTOMY  1991    Family History  Problem Relation Age of Onset   Hypertension Mother    Migraines Mother    Hypertension Father    Diabetes Maternal Grandmother    Breast cancer Neg Hx     Social History   Tobacco Use   Smoking status: Former    Types: Cigarettes    Passive exposure: Past   Smokeless tobacco: Never  Substance Use Topics   Alcohol use: Yes    Alcohol/week: 1.0 standard drink of alcohol    Types: 1 Glasses of wine per week    Comment: occasionally     Current Outpatient Medications:    Cholecalciferol (VITAMIN D ) 50 MCG (2000 UT) CAPS, Take 1 Capful by mouth daily at 12 noon., Disp: , Rfl:    desonide  (DESOWEN ) 0.05 % cream, Apply topically 2 (two) times daily., Disp: 60 g, Rfl: 2   etonogestrel -ethinyl estradiol  (NUVARING) 0.12-0.015 MG/24HR vaginal ring, Insert vaginally and leave in place for 3 consecutive weeks, then remove for 1 week., Disp: 3 each, Rfl: 4   ketoconazole  (NIZORAL ) 2 %  cream, Apply 1 Application topically daily., Disp: 30 g, Rfl: 1   pyridostigmine  (MESTINON ) 60 MG tablet, Take 1 tablet (60 mg total) by mouth daily as needed. (Patient not taking: Reported on 05/01/2024), Disp: 360 tablet, Rfl: 1  No Known Allergies  I personally reviewed active problem list, medication list, allergies, family history with the patient/caregiver today.   ROS  Ten systems reviewed and is negative except as mentioned in HPI    Objective Physical Exam  CONSTITUTIONAL: Patient  appears well-developed and well-nourished, obese.  No distress. HEENT: Head atraumatic, normocephalic, neck supple. Strabismus noticed  CARDIOVASCULAR: Normal rate, regular rhythm and normal heart sounds.  No murmur heard. No BLE edema. PULMONARY: Effort normal and breath sounds normal. No respiratory distress. ABDOMINAL: There is no tenderness or distention. MUSCULOSKELETAL: Normal gait. Without gross motor or sensory deficit. PSYCHIATRIC: Patient has a normal mood and affect. behavior is normal. Judgment and thought content normal.  Vitals:   05/01/24 0901  BP: 124/80  Pulse: 93  Resp: 16  SpO2: 100%  Weight: 196 lb 3.2 oz (89 kg)  Height: 5' 3 (1.6 m)    Body mass index is 34.76 kg/m.  Recent Results (from the past 2160 hours)  HM HIV SCREENING LAB     Status: None   Collection Time: 04/14/24 12:00 AM  Result Value Ref Range   HM HIV Screening Negative - Validated   Gonococcus culture     Status: None   Collection Time: 04/14/24 10:40 AM   Specimen: Oral Swab; Throat   OL  Result Value Ref Range   GC Culture Only Final report    Result 1 Comment     Comment: No Neisseria gonorrhoeae isolated.  WET PREP FOR TRICH, YEAST, CLUE     Status: Abnormal   Collection Time: 04/14/24 11:50 AM  Result Value Ref Range   Trichomonas Exam Positive (A) Negative   Yeast Exam Negative Negative   Clue Cell Exam Negative Negative    Comment: AMINE: NEGATIVE    Diabetic Foot Exam:     PHQ2/9:    05/01/2024    9:00 AM 08/02/2023    1:55 PM 07/24/2023    2:05 PM 06/22/2023    1:40 PM 07/21/2022    1:25 PM  Depression screen PHQ 2/9  Decreased Interest 0 0 0 0 0  Down, Depressed, Hopeless 0 0 0 0 0  PHQ - 2 Score 0 0 0 0 0  Altered sleeping  0 0  0  Tired, decreased energy  0 0  0  Change in appetite  0 0  0  Feeling bad or failure about yourself   0 0  0  Trouble concentrating  0 0  0  Moving slowly or fidgety/restless  0 0  0  Suicidal thoughts  0 0  0  PHQ-9 Score  0 0   0    phq 9 is negative  Fall Risk:    05/01/2024    8:55 AM 08/02/2023    1:55 PM 07/24/2023    2:04 PM 06/22/2023    1:35 PM 08/17/2022    3:19 PM  Fall Risk   Falls in the past year? 0 0 0 0 0  Number falls in past yr: 0 0 0 0 0  Injury with Fall? 0 0 0 0 0  Risk for fall due to : No Fall Risks No Fall Risks No Fall Risks No Fall Risks No Fall Risks  Follow up Falls evaluation completed  Falls prevention discussed Falls prevention discussed Education provided;Falls prevention discussed Falls evaluation completed      Data saved with a previous flowsheet row definition      Assessment & Plan Obesity Weight reduced from 210 lbs to 196.2 lbs, BMI now below 35. Engages in physical activity and dietary changes. - Encourage continued weight loss efforts with a goal to reach below 190 lbs. - Advise on intermittent fasting as a potential weight loss strategy. - Encourage continued physical activity and dietary modifications.  Myasthenia gravis Not regularly seeing a specialist or refilling medication. Hesitant about thymectomy but advised to consult specialists for minimally invasive options. - Encourage scheduling an appointment with Dr. Maree and a cardiovascular surgeon to discuss the potential benefits and risks of thymectomy. - Discuss the importance of being informed about surgical options and making a decision based on comprehensive information.  Eczema and intertrigo Sensitive skin with eczema breakouts, uses desonide  and triamcinolone  creams, and ketoconazole  for antifungal treatment. - Prescribe desonide  cream 0.05% with refills for sensitive areas. - Prescribe ketoconazole  cream with refills for antifungal treatment.  Contraceptive management (NuvaRing) Currently using NuvaRing and satisfied with it, has enough supply until November. - Continue current NuvaRing regimen.

## 2024-05-01 NOTE — Addendum Note (Signed)
 Addended by: GLENARD MIRE F on: 05/01/2024 09:51 AM   Modules accepted: Level of Service

## 2024-05-02 ENCOUNTER — Telehealth: Payer: Self-pay | Admitting: Pharmacy Technician

## 2024-05-02 ENCOUNTER — Other Ambulatory Visit (HOSPITAL_COMMUNITY): Payer: Self-pay

## 2024-05-02 NOTE — Telephone Encounter (Signed)
 Can we please change it to one of preferred creams?

## 2024-05-02 NOTE — Telephone Encounter (Signed)
 PA request has been Cancelled. New Encounter has been or will be created for follow up. For additional info see Pharmacy Prior Auth telephone encounter from 05/02/24.

## 2024-05-02 NOTE — Telephone Encounter (Signed)
 Pharmacy Patient Advocate Encounter   Received notification from RX Request Messages that prior authorization for Desonide  0.05% cream is required/requested.   Insurance verification completed.   The patient is insured through [facility] .   Per test claim:  Mometasone, Betamethasone  Dip, Alclometasone, or hydrocortisone cream is preferred by the insurance.  If suggested medication is appropriate, Please send in a new RX and discontinue this one. If not, please advise as to why it's not appropriate so that we may request a Prior Authorization. Please note, some preferred medications may still require a PA.  If the suggested medications have not been trialed and there are no contraindications to their use, the PA will not be submitted, as it will not be approved.   I also ran test bill for all 4 and her copay will be $0.00 for any of the above 4 preferred creams

## 2024-05-05 ENCOUNTER — Other Ambulatory Visit: Payer: Self-pay | Admitting: Internal Medicine

## 2024-05-05 DIAGNOSIS — L308 Other specified dermatitis: Secondary | ICD-10-CM

## 2024-05-05 MED ORDER — HYDROCORTISONE 1 % EX OINT: 1.0000 | TOPICAL_OINTMENT | Freq: Two times a day (BID) | CUTANEOUS | 0 refills | Status: AC

## 2024-05-05 NOTE — Telephone Encounter (Signed)
 Patient notified

## 2024-06-03 ENCOUNTER — Telehealth: Payer: Self-pay

## 2024-06-03 NOTE — Telephone Encounter (Signed)
 Copied from CRM #[id number]. Topic: Referral - Request for Referral >> Jun 03, 2024  3:20 PM Delon DASEN wrote: Did the patient discuss referral with their provider in the last year? Yes (If No - schedule appointment) (If Yes - send message)  Appointment offered? No  Type of order/referral and detailed reason for visit: neurologist  Preference of office, provider, location: [facility]- Dr Antonetta C. Mehrabyan  If referral order, have you been seen by this specialty before? Yes (If Yes, this issue or another issue? When? Where? Dr Maree  Can we respond through MyChart? Yes

## 2024-06-04 ENCOUNTER — Other Ambulatory Visit: Payer: Self-pay

## 2024-06-04 DIAGNOSIS — G7000 Myasthenia gravis without (acute) exacerbation: Secondary | ICD-10-CM

## 2024-06-04 NOTE — Telephone Encounter (Signed)
 Referral placed.

## 2024-06-27 ENCOUNTER — Ambulatory Visit

## 2024-06-27 VITALS — BP 124/80 | Ht 63.0 in | Wt 194.6 lb

## 2024-06-27 DIAGNOSIS — Z0000 Encounter for general adult medical examination without abnormal findings: Secondary | ICD-10-CM | POA: Diagnosis not present

## 2024-06-27 NOTE — Patient Instructions (Signed)
 Raven Ray,  Thank you for taking the time for your Medicare Wellness Visit. I appreciate your continued commitment to your health goals. Please review the care plan we discussed, and feel free to reach out if I can assist you further.  Medicare recommends these wellness visits once per year to help you and your care team stay ahead of potential health issues. These visits are designed to focus on prevention, allowing your provider to concentrate on managing your acute and chronic conditions during your regular appointments.  Please note that Annual Wellness Visits do not include a physical exam. Some assessments may be limited, especially if the visit was conducted virtually. If needed, we may recommend a separate in-person follow-up with your provider.  Ongoing Care Seeing your primary care provider every 3 to 6 months helps us  monitor your health and provide consistent, personalized care.   Referrals If a referral was made during today's visit and you haven't received any updates within two weeks, please contact the referred provider directly to check on the status.  Recommended Screenings:  Health Maintenance  Topic Date Due   Flu Shot  05/02/2024   HPV Vaccine (2 - Risk 3-dose series) 05/29/2024   Breast Cancer Screening  07/25/2024   Medicare Annual Wellness Visit  06/27/2025   Pap with HPV screening  07/22/2027   DTaP/Tdap/Td vaccine (9 - Td or Tdap) 05/01/2034   Hepatitis B Vaccine  Completed   Hepatitis C Screening  Completed   HIV Screening  Completed   Pneumococcal Vaccine  Aged Out   Meningitis B Vaccine  Aged Out   COVID-19 Vaccine  Discontinued       06/27/2024    9:04 AM  Advanced Directives  Does Patient Have a Medical Advance Directive? No  Would patient like information on creating a medical advance directive? No - Patient declined   Advance Care Planning is important because it: Ensures you receive medical care that aligns with your values, goals, and  preferences. Provides guidance to your family and loved ones, reducing the emotional burden of decision-making during critical moments.  Vision: Annual vision screenings are recommended for early detection of glaucoma, cataracts, and diabetic retinopathy. These exams can also reveal signs of chronic conditions such as diabetes and high blood pressure.  Dental: Annual dental screenings help detect early signs of oral cancer, gum disease, and other conditions linked to overall health, including heart disease and diabetes.  Please see the attached documents for additional preventive care recommendations.
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
## 2024-07-14 DIAGNOSIS — R29810 Facial weakness: Secondary | ICD-10-CM | POA: Diagnosis not present

## 2024-07-14 DIAGNOSIS — R5383 Other fatigue: Secondary | ICD-10-CM | POA: Diagnosis not present

## 2024-07-14 DIAGNOSIS — R9389 Abnormal findings on diagnostic imaging of other specified body structures: Secondary | ICD-10-CM | POA: Diagnosis not present

## 2024-07-14 DIAGNOSIS — R531 Weakness: Secondary | ICD-10-CM | POA: Diagnosis not present

## 2024-07-14 DIAGNOSIS — R0602 Shortness of breath: Secondary | ICD-10-CM | POA: Diagnosis not present

## 2024-07-14 DIAGNOSIS — G7000 Myasthenia gravis without (acute) exacerbation: Secondary | ICD-10-CM | POA: Diagnosis not present

## 2024-07-14 NOTE — Progress Notes (Signed)
 [facility] [address] [facility] NEUROLOGY CLINIC [address] [place] [address] [place] [address] [phone]  Division of Neuromuscular disorders [facility] Antonetta Grimmer, MD Clinical Associate Professor  Date: July 14, 2024 Patient Name: Raven Ray MRN: [id number] PCP: Glenard Dorette Joette Glenard, Dorette Joette*  Assessment:    Raven Ray is a 43 y.o. African American  female seen in consultation at the [facility] at the request of Dr. Glenard for evaluation of Myasthenia gravis. The following is the information obtained from the patient,  from her electronic medical records from [facility].  Myasthenia gravis MG symptoms are ocular only  MG metrics: MG- QOLr15: 24 MG ADL Scale: 4 MG Composite Score: 7 MG MMT: 7   Plan:    Myasthenia gravis, s/p thymectomy at the age of 43 yo, ocular at this point - blood work - discussed clinical trial, will discuss this with Dr. Georgann and [facility]   Thymic mass - thoracic surgery consult  - I'll not reorder any scans today, will check with Dr. Barbette Pickerel time: 10:38 am  End time: 11:30 am   Today's visit consisted of  52 minutes of  face to face time spent with counseling and education reviewing details of history, discussing my clinical reasoning and clinical impression, as well as a review of my recommendations for a plan of treatment as documented above.  Additional 20 minutes spent on pre and post clinical activities. A written summary was provided to the patient at the time of the visit.   Subjective:    History of Present Illness Raven Ray is a 43 year old female with myasthenia gravis who presents for a neurology consultation. She was encouraged by her primary care physician to seek specialist care.  She has a history of myasthenia gravis, diagnosed in childhood, and has not seen a  neurologist since 2019. She is frustrated with her condition, stating 'I don't like having it' and 'I just lay around all day and I deal with it.' She is seeking treatment options to improve her quality of life.  She experiences significant fatigue, getting tired quickly and unable to keep up with others, including her children. She has difficulty with tasks such as working in fast food due to her fingers not working properly and needing to rest after short periods of activity. She needs to lie down after about 30 minutes of activity.  She experiences double vision, particularly when turning her head while driving, and has learned to avoid certain movements to prevent it. She also has a history of eyelid droopiness, present her whole life, and notes that by 8 PM, she cannot open her left eye.  No current problems with speech, chewing, swallowing, or breathing. However, she mentions difficulty with grooming tasks like brushing her teeth or doing her hair if she has been active all day, due to an inability to raise her arms.  She had a thymectomy at age 46 and was informed by a previous doctor that her thymus may have regrown, which could necessitate further surgery. She is reluctant to undergo another surgery due to a previous long hospital stay and a large scar from the initial procedure.  Her current medications include prednisone , which she takes intermittently when going out with family, and Mestinon , which she has not taken since December of the previous year due to side effects. She describes the effects of Mestinon  as making  her feel like she cannot relax, although it does improve her eye symptoms.  She has two children, aged 77 and 57, and reports no family history of myasthenia gravis, noting that it is not a genetic condition. She takes over-the-counter vitamin D  but no B12 supplements, despite a history of low B12 levels. Her thyroid  function was checked and reported as normal.  Past Medical  History - Myasthenia gravis with ocular symptoms and fatigue  Surgical History: - Thymectomy (1991): Thymus removal due to myasthenia gravis   History of thymectomy Thymectomy at age 3. Concerns about residual thymic tissue. Last imaging in 2018. Further evaluation needed. - Refer to thoracic surgery for review of previous imaging and potential need for repeat imaging.  Vitamin B12 deficiency Vitamin B12 deficiency without current supplementation or recent testing.  Summary of MG history: Diagnosed: at the age of 45 Initial clinical presentation: at the age of 9 years  Immunology studies initial and the recent one from: NCS/RepStim from  [facility] initial and the most recent one from: CT-chest:recent from 2018:  IMPRESSION:  1. Anterior mediastinum soft tissue measuring up to 1.9 cm, 2.9 cc,  likely representing residual/recurrent thymus.  2. Moderate thoracic spine dextrocurvature.  3. Otherwise unremarkable CT of the chest.  Thymectomy: 05/08/1990, at the age of 9 years  Pathology results: LIGHT MICROSCOPY  Sections show thymus tissue exhibiting mild involutional change appropriate  for the age of the patient.  Small numbers of germinal centers are present.  DIAGNOSIS  THYMUS, THYMECTOMY - A FEW GERMINAL CENTERS PRESENT  Medication trials: prednisone , thymectomy, and mestinon   Hospital admissions w/exacerbations: thymectomy only  Current MG medications:  mestinon  very rare  Last Mestinon  dose: a year ago  Other pertienent blood work: - B12: low  from 293, in 2018 - TSH: 0.8 from 2024 - ANA/ENA: NA  I have reviewed imaging report: CT chest   I have reviewed blood test results, see in MG HPI    Prednisone :  day on/off Mestinon  Date/Time:  MG Quality-of-life "MG-QOL15" How true is the statement over the last four weeks  Not at all Somewhat  Very much   0 1 2  I am frustrated by my condition    x  I have trouble using my eyes   x  I have trouble eating   x   I have  limited my social activity because of my condition   x  My condition limits my ability to enjoy hobbies and fun activities   x  I have trouble meeting the needs of my family   x  I have to make plans around my condition   x  My occupational skills and my job status have been negatively affected   x  I have difficulty speaking   x   I have trouble driving  x   I am depressed about my condition   x  I have trouble walking  x   I have trouble getting around public places  x   I feel overwhelmed by my condition    x  I have trouble performing my personal grooming needs  x   Total score  24   MG ADL scale Function 0 1 2 3  Score  Double vision  None Occasional, not every day  Daily, but not constant Constant 1, only if she moves her eyes to the left or curtain way   Eyelid droop None Occasional, not every day Daily, but not constant Constant 3  Talking  Normal Intermittent slurring or nasal speech Constant slurring, can be understood Difficult to understand 0  Chewing Normal Fatigue with solid food Fatigue with soft food Gastric tube 0  Swallowing Normal Chokes rarely Frequent choking, changes in diet Gastric tube 0  Breathing  Normal Shortness of breath on exertion Shortness of breath at rest Ventilator 0  Brushing teeth/hair Normal Requires extra efforts but not rest Rest needed Cannot do one or more 1  Ability to arise from chair/toilet Normal Sometimes uses arms Always uses arms Requires assistance 0  Total MG-ADL  score  4  Other conditions contributing to this symptoms:    Examiner Anahit Mehrabyan, MD   MG Composite Scale Prednisone  on/off day?: NA Date/Time of Last AChE Dose: rarely, last year of December  Time of Exam: 10:55 am Ptosis, upward gaze (physician examination) >45 seconds = 0 11-45 seconds =1 1-10 seconds = 2 Immediate = 3,   Double vision on lateral gaze, left or right (physician examination) >45 seconds = 0 11-45 seconds =1 1-10 seconds = 3 Immediate = 4 significant  misalignment with eye movements   Eye closure (physician examination) Normal = 0 Mild weakness (can be forced open with effort) = 0 Moderate weakness (can be forced open easily) = 1 Severe weakness (unable to keep eyes closed) = 2  Talking (patient history) Normal = 0 Intermittent slurring or nasal speech = 2 Constant slurring or nasal but can be understood = 4 Difficult to understand speech = 6  Chewing (patient history) Normal = 0 Fatigue with solid food = 2 Fatigue with soft food = 4 Gastric tube = 6  Swallowing (patient history)  Normal = 0 Rare episode of choking or trouble swallowing = 2 Frequent trouble swallowing e.g. necessitating changes in diet = 5 Gastric tube = 6  Breathing (though to be caused by MG) Normal = 0  Shortness of breath with exertion = 2  Shortness of breath at rest = 4 Ventilator dependence = 9  Neck flexion or extension (weakest) (physician examination) Normal = 0 Mild weakness = 1 Moderate weakness (i.e. ~50% weak, +/- 15%) = 3 Severe weakness = 4  Shoulder abduction (physician examination)  Normal = 0  Mild weakness = 2 Moderate weakness (i.e. ~50% weak, +/- 15%) = 4 Severe weakness = 5  Hip flexion (physician examination) Normal = 0 Mild weakness = 2 Moderate weakness (i.e. ~50% weak, +/- 15%) = 4 Severe weakness = 5   Please note that "moderate weakness" for neck and limb items should be construed as weakness that equals roughly 50% +/- 15% of expected normal strength. Any weakness milder than that would be "mild" and any weakness more severe than that would be classified as "severe". Total score: 7   Manual Muscle Testing   Score each function as: 0 - normal, 1 - 25% weak/mild impairment, 2 - 50% weak/moderate impairment, 3 - 75% weak/severe impairment, 4 - paralyzed/unable to do       Right Left SUM  Cranial Nerves Lid ptosis 0 2 2    Diplopia 2 2 4     Eye closure - - 1    Cheek puff - - 0    Tongue protrusion - - 0    Jaw closure - - 0        Cranial muscle  score 7  Limb muscles Neck flexion - - 0    Neck extension - - 0    Shoulder abduction (deltoid) 0 0 0  Elbow flexion (biceps) 0 0 0    Elbow extension (triceps) 0 0 0    Wrist extension 0 0 0    Grip 0 0 0    Hip flexion (psoas) 0 0 0    Knee extension (quadriceps) 0 0 0    Knee flexion (hamstrings) 0 0 0    Ankle dorsiflexion (TA) 0 0 0    Ankle plantarflexion (triceps surae) 0 0 0        Limb muscle score 0        TOTAL 7    MGFA class:  MGFA PIS: List any conditions other than MG causing weakness in any of these muscles:   MGFA Clinical Classification  Class I: Any ocular muscle weakness; may have weakness of eye closure. All other muscle strength is normal.  Past Medical Hx: Past Medical History[1]  Past Surgical Hx: Past Surgical History[2]  Social Hx: Social History[3]  Family Hx: Family History[4]  ALLERGIES: Allergies[5]  CURRENT MEDICATIONS:  Prior to Admission medications  Medication Sig Start Date End Date Taking? Authorizing Provider  cholecalciferol, vitamin D3-50 mcg, 2,000 unit,, 50 mcg (2,000 unit) cap Take by mouth.   Yes [provider]  etonogestrel -ethinyl estradiol  (NUVARING) 0.12-0.015 mg/24 hr vaginal ring INSERT 1 RING VAGINALLY AS DIRECTED. REMOVE AFTER 3 WEEKS & WAIT 7 DAYS BEFORE INSERTING A NEW RING   Yes [provider]  pyridostigmine  (MESTINON ) 60 mg tablet Take 1 tablet (60 mg total) by mouth. 02/14/10 09/03/28 Yes [provider]    Review of Systems: A 10-systems review was performed and, unless otherwise noted, declared negative by patient.   Objective:   Vitals:   07/14/24 1008  BP: 116/62  BP Site: R Arm  BP Position: Sitting  BP Cuff Size: Medium  Pulse: 67  Resp: 17  Temp: 36.2 C (97.1 F)  TempSrc: Temporal  Weight: 89.4 kg (197 lb 3.2 oz)  Height: 165 cm (5' 4.96)   Physical Exam: GENERAL:NAD, awake, alert, well nourished  Eyes: no scleral injection, no tearing ENT: Moist  mucous membranes of the oral cavity.  Neck: Supple, no carotid bruits  Cardiovascular: RRR, w/o any murmurs, rub, or gallop.  Lungs: CTAB, no wheezes/crackles/rhonchi.  Skin: No rash, lesions, breakdown  Psychiatry: Mood and affect appropriate  Abdomen: soft, non tender, non distended,  Extremeties: No cyanosis, clubbing or edema.   Neurological Examination:  Mental Status:  alert, oriented to person, place and time. Speech with no  dysarthria.   Cranial Nerves:  II, III- Pupils are equal 3 mm and reactive to light b/l, visual fields are intact  III, IV, VI- extraocular movements are intact   right left  Ptosis/immediate    after sustained gaze  2-10, fatigable   Diplopia/ immediate + +  after sustained gaze    EOM    abduction Near plegia Restricted   adduction Normal  Restricted more than abd  up plegia plegia  down Normal  Normal   VII- face symmetrical, no facial droop, normal facial movements with smile/grimace  VIII- Hearing grossly intact.  IX and X- symmetric palate contraction  XI- Full shoulder shrug bilaterally  XII- Tongue protrudes midline, full range of movements of the tongue  Motor Exam:   Normal bulk and tone, no abnormal spontaneous movements observed in the muscles, such as fasciculations, myokymia. Patient did not have high arched feet, hammer toes, kyphoscoliosis.  5/5 neck flexion/extension Eye closure: weakness, mild  Buccinator: normal Jaw strength: normal  Muscle  strength in the upper and lower extremities was assessed using MRC score (Right/Left). There was no pain with passive and active movements of the upper and lower extremities. Arm Abduction (deltoids, supraspinati): 5/5  Arm Adduction: 5/5 Arm Flexion: 5/5   Arm Extension: 5/5  Wrist Extension: 5/5 Wrist Flexion: 5/5  Hand grip: 5/5  Long Finger Flexors: median innervated 5/5, ulnar innervated 5/5 Finger Extensors: 5/5  Hip Flexion: 5/5 Hip Eextension: 5/5  Hip Abd: 5/5  Hip Add:  5/5  Knee Flexion: 5/5 Knee Eextension: 5/5  Foot eversion: 5/5  Foot inversion: 5/5 Foot Dorsiflexion: 5/5  Foot Plantar flexion: 5/5  Big toe Dorsiflexion: 5/5  Big toe Plantar flexion: 5/5   Sensory UEs LEs   R L R L  Light touch WNL WNL WNL WNL  Joint position sense WNL WNL WNL WNL    Reflexes R L  Biceps 0 +2  Triceps +2 +2  Patella +2 +2  Achilles +2 0   Plantar reflexes are down going. No pathological reflexes.  Coordination: FtN without dysmetria or ataxia.SABRA HtS without dysmetria or ataxia.   Gait: normal    Diagnostic Studies and Review of Records:  Please see in HPI        [1] No past medical history on file. [2] No past surgical history on file. [3] Social History Socioeconomic History  . Marital status: Single    Spouse name: None  . Number of children: None  . Years of education: None  . Highest education level: None  Tobacco Use  . Smoking status: Former    Current packs/day: 0.50    Types: Cigarettes  . Smokeless tobacco: Never  Substance and Sexual Activity  . Alcohol use: Never  . Drug use: Never   Social Drivers of Corporate investment banker Strain: Low Risk  (06/22/2023)   Received from [facility]   Overall Financial Resource Strain (CARDIA)   . Difficulty of Paying Living Expenses: Not hard at all  Food Insecurity: No Food Insecurity (06/22/2023)   [facility] from [facility]   Hunger Vital Sign   . Worried About Programme researcher, broadcasting/film/video in the Last Year: Never true   . Ran Out of Food in the Last Year: Never true  Transportation Needs: No Transportation Needs (06/22/2023)   Received from [facility] - Transportation   . Lack of Transportation (Medical): No   . Lack of Transportation (Non-Medical): No  Physical Activity: Sufficiently Active (07/24/2023)   Received from [facility]   Exercise Vital Sign   . Days of Exercise per Week: 4 days   . Minutes of Exercise per Session: 60 min  Stress: No Stress Concern Present  (06/22/2023)   Received from [facility] - Occupational Stress Questionnaire   . Feeling of Stress : Not at all  Social Connections: Moderately Integrated (06/22/2023)   Received from [facility]   Social Connection and Isolation Panel   . Frequency of Communication with Friends and Family: More than three times a week   . Frequency of Social Gatherings with Friends and Family: More than three times a week   . Attends Religious Services: More than 4 times per year   . Active Member of Clubs or Organizations: Yes   . Attends Banker Meetings: 1 to 4 times per year   . Marital Status: Never married  [4] No family history on file. [5] No Known Allergies

## 2024-07-21 ENCOUNTER — Other Ambulatory Visit: Payer: Self-pay | Admitting: Family Medicine

## 2024-07-21 DIAGNOSIS — Z1231 Encounter for screening mammogram for malignant neoplasm of breast: Secondary | ICD-10-CM

## 2024-07-24 ENCOUNTER — Ambulatory Visit

## 2024-07-24 DIAGNOSIS — Z113 Encounter for screening for infections with a predominantly sexual mode of transmission: Secondary | ICD-10-CM

## 2024-07-24 LAB — WET PREP FOR TRICH, YEAST, CLUE
Clue Cell Exam: NEGATIVE
Trichomonas Exam: NEGATIVE
Yeast Exam: NEGATIVE

## 2024-07-24 LAB — HM HIV SCREENING LAB: HM HIV Screening: NEGATIVE

## 2024-07-24 NOTE — Progress Notes (Signed)
 Pt is here for STD screening. Wet prep results reviewed with patient and requires no treatment per SO. Condoms declined. Kwadwo Shterna Laramee,RN.

## 2024-07-24 NOTE — Progress Notes (Signed)
 [facility] Department STI clinic [address], [address] [place] [address] Main phone: [phone]  STI screening visit  Subjective:  Raven Ray is a 43 y.o. female being seen today for an STI screening visit. The patient reports they do have symptoms.  Patient reports that they do not desire a pregnancy in the next year.  Patient's last menstrual period was 06/17/2024 (exact date).  Patient has the following medical conditions:  Patient Active Problem List   Diagnosis Date Noted   Vitamin D  deficiency 07/21/2022   Chronic pain of left knee 03/17/2019   Myasthenia gravis (HCC) 10/10/2016   Eczema 10/10/2016   Chief Complaint  Patient presents with   SEXUALLY TRANSMITTED DISEASE   HPI Patient reports desire for STI testing due to genital itching. She had a single sexual encounter around 3 weeks ago. No known contacts.  Does the patient using douching products? No  See flowsheet for further details and programmatic requirements Hyperlink available at the top of the signed note in blue.  Flow sheet content below:  Pregnancy Intention Screening Does the patient want to become pregnant in the next year?: No Does the patient's partner want to become pregnant in the next year?: No Would the patient like to discuss contraceptive options today?: No Reason For STD Screen STD Screening: Has symptoms Have you ever had an STD?: Yes History of Antibiotic use in the past 2 weeks?: No STD Symptoms Denies all: No Genital Itching: Yes Lower abdominal pain: No Discharge: No Dysuria: No Genital ulcer / lesion: No Rash: No Vaginal irritation: No Oral / Other skin ulcer: No Pain with sex: No Sore Throat: No Visual Changes: No Vaginal Bleeding: No Risk Factors for Hep B Household, sexual, or needle sharing contact of a person infected with Hep B: No Sexual contact with a person who uses drugs not as prescribed?: No Currently or Ever used drugs not  as prescribed: No HIV Positive: No PRep Patient: No Men who have sex with men: No Have Hepatitis C: No History of Incarceration: No History of Homeslessness?: No Anal sex following anal drug use?: No Risk Factors for Hep C Currently using drugs not as prescribed: No Sexual partner(s) currently using drugs as not prescribed: No History of drug use: No HIV Positive: No People with a history of incarceration: No People born between the years of 70 and 53: No Counseling Patient counseled to use condoms with all sex: Condoms declined RTC in 2-3 weeks for test results: Yes Clinic will call if test results abnormal before test result appt.: Yes Immunizations: Referred Test results given to patient Patient counseled to use condoms with all sex: Condoms declined   Screening for MPX risk:  Unexplained rash?  No   MSM?  No   Multiple or anonymous sex partners?  No   Any close or sexual contact with a person  diagnosed with MPX?  No   Any outside the [place]  where MPX is endemic?  No   High clinical suspicion for MPX?    -Unlikely to be chickenpox    -Lymphadenopathy    -Rash that presents in same phase of       evolution on any given body part  No   Screenings: Last HIV test per patient/review of record was  Lab Results  Component Value Date   HMHIVSCREEN Negative - Validated 04/14/2024    Lab Results  Component Value Date   HIV NON-REACTIVE 07/24/2023     Last HEPC  test per patient/review of record was  Lab Results  Component Value Date   HMHEPCSCREEN Negative-Validated 12/20/2020   No components found for: HEPC   Last HEPB test per patient/review of record was No components found for: HMHEPBSCREEN   Patient reports last pap was:   No results found for: SPECADGYN Result Date Procedure Results Follow-ups  07/21/2022 Cytology - PAP High risk HPV: Negative Neisseria Gonorrhea: Negative Chlamydia: Negative Adequacy: Satisfactory for evaluation; transformation zone  component PRESENT. Diagnosis: - Negative for intraepithelial lesion or malignancy (NILM) Microorganisms: Fungal organisms present consistent with Candida spp. Comment: Normal Reference Range HPV - Negative Comment: Normal Reference Ranger Chlamydia - Negative Comment: Normal Reference Range Neisseria Gonorrhea - Negative   10/03/2017 HM PAP SMEAR HM Pap smear: Normal Pap and Negative HPV     Immunization history:  Immunization History  Administered Date(s) Administered   Dtap, Unspecified 09/10/1980, 11/05/1980, 02/01/1981, 02/09/1982, 03/06/1986   HPV 9-valent 05/01/2024   Hep A / Hep B 05/03/2006, 06/07/2006, 12/13/2010   Influenza, Seasonal, Injecte, Preservative Fre 07/24/2023   Influenza,inj,Quad PF,6+ Mos 06/14/2017, 07/21/2022   MMR 10/28/1981, 03/06/1986   PNEUMOCOCCAL CONJUGATE-20 07/21/2022   Polio, Unspecified 09/10/1980, 11/05/1980, 02/01/1981, 02/09/1982, 03/06/1986   Tdap 04/25/2010, 06/10/2013, 05/01/2024    The following portions of the patient's history were reviewed and updated as appropriate: allergies, current medications, past medical history, past social history, past surgical history and problem list.  Objective:  There were no vitals filed for this visit.  Physical Exam Vitals and nursing note reviewed. Exam conducted with a chaperone present Brett Orange).  Constitutional:      Appearance: Normal appearance.  HENT:     Head: Normocephalic and atraumatic.     Mouth/Throat:     Mouth: Mucous membranes are moist.     Pharynx: Oropharynx is clear. No oropharyngeal exudate or posterior oropharyngeal erythema.  Pulmonary:     Effort: Pulmonary effort is normal.  Abdominal:     General: Abdomen is flat.     Palpations: There is no mass.     Tenderness: There is no abdominal tenderness. There is no rebound.  Genitourinary:    General: Normal vulva.     Exam position: Lithotomy position.     Pubic Area: No rash or pubic lice.      Labia:        Right: No  rash or lesion.        Left: No rash or lesion.      Vagina: Normal. No vaginal discharge, erythema, bleeding or lesions.     Cervix: No cervical motion tenderness, discharge, friability, lesion or erythema.     Uterus: Normal.      Adnexa: Right adnexa normal and left adnexa normal.     Rectum: Normal.     Comments: pH = <4.5 Lymphadenopathy:     Head:     Right side of head: No preauricular or posterior auricular adenopathy.     Left side of head: No preauricular or posterior auricular adenopathy.     Cervical: No cervical adenopathy.     Upper Body:     Right upper body: No supraclavicular, axillary or epitrochlear adenopathy.     Left upper body: No supraclavicular, axillary or epitrochlear adenopathy.     Lower Body: No right inguinal adenopathy. No left inguinal adenopathy.  Skin:    General: Skin is warm and dry.     Findings: No rash.  Neurological:     Mental Status: She is alert and  oriented to person, place, and time.    Assessment and Plan:  Raven Ray is a 43 y.o. female presenting to the [facility] Department for STI screening  1. Screening for venereal disease (Primary)  - Chlamydia/Gonorrhea [place] [facility] - WET PREP FOR TRICH, YEAST, CLUE - HIV [place] [facility] - Syphilis Serology, [place] [facility] - Gonococcus culture   Patient accepted the following screenings: oral GC culture, vaginal CT/GC swab, vaginal wet prep, HIV, and RPR Patient meets criteria for HepB screening? No. Ordered? no Patient meets criteria for HepC screening? No. Ordered? no  Treat wet prep per standing order Discussed time line for [facility] Lab results and that patient will be called with positive results and encouraged patient to call if she had not heard in 2 weeks.  Counseled to return or seek care for continued or worsening symptoms Recommended repeat testing in 3 months with positive results. Recommended condom use with all sex for STI prevention.   Return if symptoms worsen  or fail to improve.  Future Appointments  Date Time Provider Department Center  08/01/2024 10:40 AM Glenard Mire, MD [facility] [place]  08/21/2024  3:40 PM [facility] MM DEXA [facility]  05/07/2025  8:40 AM Glenard Mire, MD [facility] [place]  07/03/2025  9:30 AM [facility]NUAL WELLNESS VISIT [facility] Michaela Damien FORBES Rosabel, NP

## 2024-07-28 LAB — GONOCOCCUS CULTURE

## 2024-08-01 ENCOUNTER — Ambulatory Visit: Admitting: Family Medicine

## 2024-08-01 VITALS — BP 126/82 | HR 78 | Resp 16 | Ht 63.0 in | Wt 195.0 lb

## 2024-08-01 DIAGNOSIS — N926 Irregular menstruation, unspecified: Secondary | ICD-10-CM

## 2024-08-01 DIAGNOSIS — L308 Other specified dermatitis: Secondary | ICD-10-CM | POA: Diagnosis not present

## 2024-08-01 DIAGNOSIS — J9859 Other diseases of mediastinum, not elsewhere classified: Secondary | ICD-10-CM | POA: Diagnosis not present

## 2024-08-01 DIAGNOSIS — R918 Other nonspecific abnormal finding of lung field: Secondary | ICD-10-CM | POA: Diagnosis not present

## 2024-08-01 DIAGNOSIS — Z0000 Encounter for general adult medical examination without abnormal findings: Secondary | ICD-10-CM | POA: Diagnosis not present

## 2024-08-01 DIAGNOSIS — G7000 Myasthenia gravis without (acute) exacerbation: Secondary | ICD-10-CM | POA: Diagnosis not present

## 2024-08-01 DIAGNOSIS — R911 Solitary pulmonary nodule: Secondary | ICD-10-CM | POA: Diagnosis not present

## 2024-08-01 LAB — POCT URINE PREGNANCY: Preg Test, Ur: NEGATIVE

## 2024-08-01 MED ORDER — TRIAMCINOLONE ACETONIDE 0.1 % EX CREA: 1.0000 | TOPICAL_CREAM | Freq: Two times a day (BID) | CUTANEOUS | 0 refills | Status: AC

## 2024-08-01 NOTE — Progress Notes (Signed)
 Name: Raven Ray   MRN: [id number]    DOB: August 03, 1981   Date:08/01/2024       Progress Note  Subjective  Chief Complaint  Chief Complaint  Patient presents with   Annual Exam    HPI  Patient presents for annual CPE and discuss rash on elbow  She noticed a itchy spot that is bumpy on right elbow about 2 months ago, not improving with ketoconazole . No fever or chills. It waxes and wanes but she is tired of it   Diet: continue balanced diet  Exercise: increase frequency of walks   Last Eye Exam: completed Last Dental Exam: completed  Flowsheet Row Office Visit from 08/01/2024 in [facility]  AUDIT-C Score 0    Depression: Phq 9 is  negative    08/01/2024   10:44 AM 06/27/2024    9:10 AM 05/01/2024    9:00 AM 08/02/2023    1:55 PM 07/24/2023    2:05 PM  Depression screen PHQ 2/9  Decreased Interest 0 0 0 0 0  Down, Depressed, Hopeless 0 0 0 0 0  PHQ - 2 Score 0 0 0 0 0  Altered sleeping 0 0  0 0  Tired, decreased energy 0 0  0 0  Change in appetite 0 0  0 0  Feeling bad or failure about yourself  0 0  0 0  Trouble concentrating 0 0  0 0  Moving slowly or fidgety/restless 0 0  0 0  Suicidal thoughts 0 0  0 0  PHQ-9 Score 0 0  0 0  Difficult doing work/chores  Not difficult at all      Hypertension: BP Readings from Last 3 Encounters:  08/01/24 126/82  06/27/24 124/80  05/01/24 124/80   Obesity: Wt Readings from Last 3 Encounters:  08/01/24 195 lb (88.5 kg)  06/27/24 194 lb 9.6 oz (88.3 kg)  05/01/24 196 lb 3.2 oz (89 kg)   BMI Readings from Last 3 Encounters:  08/01/24 34.54 kg/m  06/27/24 34.47 kg/m  05/01/24 34.76 kg/m     Vaccines: reviewed with the patient.   Hep C Screening: completed STD testing and prevention (HIV/chl/gon/syphilis): recently done Intimate partner violence: negative screen  Sexual History : no problems  Menstrual History/LMP/Abnormal Bleeding: regular cycles, not heavy, she forgot to remove her   nuvaring and will remove it today.  Discussed importance of follow up if any post-menopausal bleeding: not applicable  Incontinence Symptoms: negative for symptoms   Breast cancer:  - Last Mammogram: scheduled  - BRCA gene screening: N/A  Osteoporosis Prevention : Discussed high calcium and vitamin D  supplementation, weight bearing exercises Bone density :not applicable   Cervical cancer screening: up-to-date  Skin cancer: Discussed monitoring for atypical lesions  Colorectal cancer: start next year    Lung cancer:  Low Dose CT Chest recommended if Age 3-80 years, 20 pack-year currently smoking OR have quit w/in 15years. Patient does not qualify for screen   ECG: last one in 2019   Advanced Care Planning: A voluntary discussion about advance care planning including the explanation and discussion of advance directives.  Discussed health care proxy and Living will, and the patient was able to identify a health care proxy as daughter .  Patient does not have a living will and power of attorney of health care   Patient Active Problem List   Diagnosis Date Noted   Vitamin D  deficiency 07/21/2022   Chronic pain of left knee 03/17/2019  Myasthenia gravis (HCC) 10/10/2016   Eczema 10/10/2016    Past Surgical History:  Procedure Laterality Date   CESAREAN SECTION  10/10/1999   TOTAL THYMECTOMY  1991    Family History  Problem Relation Age of Onset   Hypertension Mother    Migraines Mother    Hypertension Father    Diabetes Maternal Grandmother    Breast cancer Neg Hx     Social History   Socioeconomic History   Marital status: Single    Spouse name: Not on file   Number of children: 2   Years of education: some college   Highest education level: Bachelor's degree (e.g., BA, AB, BS)  Occupational History   Occupation: Disabled  Tobacco Use   Smoking status: Former    Types: Cigarettes    Passive exposure: Past   Smokeless tobacco: Never  Vaping Use   Vaping status:  Never Used  Substance and Sexual Activity   Alcohol use: Yes    Alcohol/week: 1.0 standard drink of alcohol    Types: 1 Glasses of wine per week    Comment: occasionally   Drug use: No   Sexual activity: Not Currently    Partners: Male    Birth control/protection: Other-see comments    Comment: Nuva Ring  Other Topics Concern   Not on file  Social History Narrative   She is on disability but she states she went back to work part time to not feel disabled. 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       She completed school to become a para-legal       Social Drivers of Health   Financial Resource Strain: Low Risk  (08/01/2024)   Overall Financial Resource Strain (CARDIA)    Difficulty of Paying Living Expenses: Not very hard  Food Insecurity: No Food Insecurity (08/01/2024)   Hunger Vital Sign    Worried About Running Out of Food in the Last Year: Never true    Ran Out of Food in the Last Year: Never true  Transportation Needs: No Transportation Needs (08/01/2024)   PRAPARE - Administrator, Civil Service (Medical): No    Lack of Transportation (Non-Medical): No  Physical Activity: Insufficiently Active (08/01/2024)   Exercise Vital Sign    Days of Exercise per Week: 4 days    Minutes of Exercise per Session: 30 min  Stress: No Stress Concern Present (08/01/2024)   [employer] of Occupational Health - Occupational Stress Questionnaire    Feeling of Stress: Only a little  Social Connections: Moderately Integrated (08/01/2024)   Social Connection and Isolation Panel    Frequency of Communication with Friends and Family: More than three times a week    Frequency of Social Gatherings with Friends and Family: More than three times a week    Attends Religious Services: More than 4 times per year    Active Member of [employer] or Organizations: Yes    Attends Banker Meetings: More than 4 times per year    Marital Status: Never married  Intimate Partner  Violence: Not At Risk (06/27/2024)   Humiliation, Afraid, Rape, and Kick questionnaire    Fear of Current or Ex-Partner: No    Emotionally Abused: No    Physically Abused: No    Sexually Abused: No     Current Outpatient Medications:    desonide  (DESOWEN ) 0.05 % cream, Apply topically 2 (two) times daily., Disp: 60 g, Rfl: 2   etonogestrel -ethinyl estradiol  (NUVARING) 0.12-0.015  MG/24HR vaginal ring, Insert vaginally and leave in place for 3 consecutive weeks, then remove for 1 week., Disp: 3 each, Rfl: 4   hydrocortisone  1 % ointment, Apply 1 Application topically 2 (two) times daily., Disp: 30 g, Rfl: 0   ketoconazole  (NIZORAL ) 2 % cream, Apply 1 Application topically daily., Disp: 30 g, Rfl: 1   pyridostigmine  (MESTINON ) 60 MG tablet, Take 1 tablet (60 mg total) by mouth daily as needed., Disp: 360 tablet, Rfl: 1  No Known Allergies   ROS  Constitutional: Negative for fever or weight change.  Respiratory: Negative for cough and shortness of breath.   Cardiovascular: Negative for chest pain or palpitations.  Gastrointestinal: Negative for abdominal pain, no bowel changes.  Musculoskeletal: Negative for gait problem or joint swelling.  Skin: Negative for rash.  Neurological: Negative for dizziness or headache.  No other specific complaints in a complete review of systems (except as listed in HPI above).   Objective  Vitals:   08/01/24 1045  BP: 126/82  Pulse: 78  Resp: 16  SpO2: 98%  Weight: 195 lb (88.5 kg)  Height: 5' 3 (1.6 m)    Body mass index is 34.54 kg/m.  Physical Exam  Constitutional: Patient appears well-developed and well-nourished. No distress.  HENT: Head: Normocephalic and atraumatic. Ears: B TMs ok, no erythema or effusion; Nose: Nose normal. Mouth/Throat: Oropharynx is clear and moist. No oropharyngeal exudate.  Eyes: Conjunctivae and EOM are normal. Pupils are equal, round, and reactive to light. No scleral icterus.  Neck: Normal range of motion.  Neck supple. No JVD present. No thyromegaly present.  Cardiovascular: Normal rate, regular rhythm and normal heart sounds.  No murmur heard. No BLE edema. Pulmonary/Chest: Effort normal and breath sounds normal. No respiratory distress. Abdominal: Soft. Bowel sounds are normal, no distension. There is no tenderness. no masses Breast: no lumps or masses, no nipple discharge or rashes FEMALE GENITALIA:  Not done  RECTAL: not done  Musculoskeletal: Normal range of motion, no joint effusions. No gross deformities Neurological: he is alert and oriented to person, place, and time. No cranial nerve deficit. Coordination, balance, strength, speech and gait are normal.  Skin: Skin is warm and dry.  Rash below  Psychiatric: Patient has a normal mood and affect. behavior is normal. Judgment and thought content normal.       Assessment & Plan  1. Well adult exam (Primary)  Needs to exercise more Vaccines up to date  2. Missed period  - POCT urine pregnancy  Explained how to use Nuvaring correctly Use condoms for STI prevention   3. Other eczema  - triamcinolone  cream (KENALOG ) 0.1 %; Apply 1 Application topically 2 (two) times daily. Apply to elbow  Dispense: 45 g; Refill: 0    -USPSTF grade A and B recommendations reviewed with patient; age-appropriate recommendations, preventive care, screening tests, etc discussed and encouraged; healthy living encouraged; see AVS for patient education given to patient -Discussed importance of 150 minutes of physical activity weekly, eat two servings of fish weekly, eat one serving of tree nuts ( cashews, pistachios, pecans, almonds.SABRA) every other day, eat 6 servings of fruit/vegetables daily and drink plenty of water and avoid sweet beverages.   -Reviewed Health Maintenance: Yes.

## 2024-08-21 ENCOUNTER — Ambulatory Visit
Admission: RE | Admit: 2024-08-21 | Discharge: 2024-08-21 | Disposition: A | Discharge status: Home / Self Care | Source: Ambulatory Visit | Attending: Family Medicine | Admitting: Family Medicine

## 2024-08-21 DIAGNOSIS — Z1231 Encounter for screening mammogram for malignant neoplasm of breast: Secondary | ICD-10-CM | POA: Insufficient documentation

## 2024-08-27 ENCOUNTER — Ambulatory Visit

## 2024-08-27 DIAGNOSIS — A599 Trichomoniasis, unspecified: Secondary | ICD-10-CM

## 2024-08-27 DIAGNOSIS — Z113 Encounter for screening for infections with a predominantly sexual mode of transmission: Secondary | ICD-10-CM

## 2024-08-27 LAB — WET PREP FOR TRICH, YEAST, CLUE
Clue Cell Exam: NEGATIVE
Trichomonas Exam: POSITIVE — AB
Yeast Exam: NEGATIVE

## 2024-08-27 LAB — HM HIV SCREENING LAB: HM HIV Screening: NEGATIVE

## 2024-08-27 MED ORDER — METRONIDAZOLE 500 MG PO TABS: 500.0000 mg | ORAL_TABLET | Freq: Two times a day (BID) | ORAL | Status: AC

## 2024-08-27 NOTE — Progress Notes (Signed)
 [facility] Department STI clinic [address] [place] [address] Main phone: [phone]  STI screening visit  Subjective:  Raven Ray is a 43 y.o. female being seen today for an STI screening visit. The patient reports they do have symptoms.  Patient reports that they do not desire a pregnancy in the next year.   They reported they are not interested in discussing contraception today.    Patient's last menstrual period was 08/04/2024 (exact date).  Patient has the following medical conditions:  Patient Active Problem List   Diagnosis Date Noted   Vitamin D  deficiency 07/21/2022   Chronic pain of left knee 03/17/2019   Myasthenia gravis (HCC) 10/10/2016   Eczema 10/10/2016   Chief Complaint  Patient presents with   SEXUALLY TRANSMITTED DISEASE   HPI Patient reports itching in her vaginal area. Has discharge with a bread-ish yeast odor. Symptoms for 1 week. Had 1 new sexual partner 3 weeks ago, no condom. Last pap NILM/negative HPV in 2023. Does the patient using douching products? No  Last HIV test per patient/review of record was  Lab Results  Component Value Date   HMHIVSCREEN Negative - Validated 07/24/2024    Lab Results  Component Value Date   HIV NON-REACTIVE 07/24/2023   Last HEPC test per patient/review of record was  Lab Results  Component Value Date   HMHEPCSCREEN Negative-Validated 12/20/2020   No components found for: HEPC   Last HEPB test per patient/review of record was No components found for: HMHEPBSCREEN   Last cervical cancer screening: Not applicable as patient is under 25 years old.   Screening for MPX risk:  Unexplained rash?  No   MSM?  No   Multiple or anonymous sex partners?  No   Any close or sexual contact with a person  diagnosed with MPX?  No   Any outside the [place]  where MPX is endemic?  No   High clinical suspicion for MPX?    -Unlikely to be chickenpox    -Lymphadenopathy    -Rash  that presents in same phase of       evolution on any given body part  No   See flowsheet for further details and programmatic requirements Hyperlink available at the top of the signed note in blue.  Flow sheet content below:  Pregnancy Intention Screening Does the patient want to become pregnant in the next year?: No Does the patient's partner want to become pregnant in the next year?: No Would the patient like to discuss contraceptive options today?: No Reason For STD Screen STD Screening: Has symptoms Have you ever had an STD?: Yes History of Antibiotic use in the past 2 weeks?: No STD Symptoms Denies all: No Genital Itching: Yes Lower abdominal pain: No Discharge: Yes Dysuria: No Genital ulcer / lesion: No Rash: No Vaginal irritation: No Oral / Other skin ulcer: No Pain with sex: No Sore Throat: No Visual Changes: No Vaginal Bleeding: No Risk Factors for Hep B Household, sexual, or needle sharing contact of a person infected with Hep B: No Sexual contact with a person who uses drugs not as prescribed?: No Currently or Ever used drugs not as prescribed: No HIV Positive: No PRep Patient: No Men who have sex with men: No Have Hepatitis C: No History of Incarceration: No History of Homeslessness?: No Anal sex following anal drug use?: No Risk Factors for Hep C Currently using drugs not as prescribed: No Sexual partner(s) currently using drugs as  not prescribed: No History of drug use: No HIV Positive: No People with a history of incarceration: No People born between the years of 72 and 16: No Counseling Patient counseled to use condoms with all sex: Condoms declined RTC in 2-3 weeks for test results: Yes Clinic will call if test results abnormal before test result appt.: Yes Test results given to patient Patient counseled to use condoms with all sex: Condoms declined  Immunization history:  Immunization History  Administered Date(s) Administered   Dtap,  Unspecified 09/10/1980, 11/05/1980, 02/01/1981, 02/09/1982, 03/06/1986   HPV 9-valent 05/01/2024, 07/22/2024   Hep A / Hep B 05/03/2006, 06/07/2006, 12/13/2010   Influenza, Mdck, Trivalent,PF 6+ MOS(egg free) 07/31/2024   Influenza, Seasonal, Injecte, Preservative Fre 07/24/2023   Influenza,inj,Quad PF,6+ Mos 06/14/2017, 07/21/2022   MMR 10/28/1981, 03/06/1986   PNEUMOCOCCAL CONJUGATE-20 07/21/2022   Polio, Unspecified 09/10/1980, 11/05/1980, 02/01/1981, 02/09/1982, 03/06/1986   Tdap 04/25/2010, 06/10/2013, 05/01/2024   The following portions of the patient's history were reviewed and updated as appropriate: allergies, current medications, past medical history, past social history, past surgical history and problem list.  Objective:  There were no vitals filed for this visit.  Physical Exam Vitals and nursing note reviewed. Exam conducted with a chaperone present Brett Orange).  Constitutional:      Appearance: Normal appearance.  HENT:     Head: Normocephalic and atraumatic.     Comments: No nits or hair loss of scalp, brows, and lashes    Mouth/Throat:     Mouth: Mucous membranes are moist.     Pharynx: Oropharynx is clear. No oropharyngeal exudate or posterior oropharyngeal erythema.  Eyes:     General:        Right eye: No discharge.        Left eye: No discharge.     Conjunctiva/sclera: Conjunctivae normal.     Right eye: Right conjunctiva is not injected.     Left eye: Left conjunctiva is not injected.  Pulmonary:     Effort: Pulmonary effort is normal.  Abdominal:     General: Abdomen is protuberant.     Palpations: Abdomen is soft.     Tenderness: There is no abdominal tenderness. There is no rebound.  Genitourinary:    General: Normal vulva.     Exam position: Lithotomy position.     Pubic Area: No rash or pubic lice.      Labia:        Right: No rash or lesion.        Left: No rash or lesion.      Vagina: Vaginal discharge present. No erythema or lesions.      Cervix: No cervical motion tenderness, discharge, lesion or erythema.     Comments: pH = <4.5 Green, frothy discharge Lymphadenopathy:     Cervical: No cervical adenopathy.     Right cervical: No superficial or posterior cervical adenopathy.    Left cervical: No superficial or posterior cervical adenopathy.     Upper Body:     Right upper body: No supraclavicular adenopathy.     Left upper body: No supraclavicular adenopathy.  Skin:    General: Skin is warm and dry.     Findings: No lesion or rash.  Neurological:     Mental Status: She is alert and oriented to person, place, and time.    Assessment and Plan:  HEAVIN SEBREE is a 43 y.o. female presenting to the [facility] Department for STI screening  1. Screening for  venereal disease (Primary)  - WET PREP FOR TRICH, YEAST, CLUE - Chlamydia/Gonorrhea [facility] - HIV [facility] - Syphilis Serology, [facility]  2. Trichomonas infection  - metroNIDAZOLE  (FLAGYL ) 500 MG tablet; Take 1 tablet (500 mg total) by mouth 2 (two) times daily for 7 days.   Patient accepted all screenings including vaginal CT/GC and bloodwork for HIV/RPR, and wet prep. Patient meets criteria for HepB screening? No. Ordered? no Patient meets criteria for HepC screening? No. Ordered? no  Discussed time line for [facility] results and that patient will be called with positive results and encouraged patient to call if she had not heard in 2 weeks.  Counseled to return or seek care for continued or worsening symptoms Recommended repeat testing in 3 months with positive results. Recommended condom use with all sex for STI prevention.   Patient is currently using *Nuvaring to prevent pregnancy.    Return in about 3 months (around 11/27/2024), or if symptoms worsen or fail to improve.  Future Appointments  Date Time Provider Department Center  02/02/2025 10:40 AM Glenard Mire, MD [facility]  07/03/2025  9:30 AM [facility] VISIT [facility]  08/05/2025 10:40 AM Glenard Mire, MD [facility] Michaela Damien FORBES Rosabel, NP

## 2024-08-27 NOTE — Progress Notes (Signed)
 Pt is here for STD screening. Wet prep results reviewed with patient and was dispensed metronidazole  500 mg tablets 2x/day for 7 days.Contact card, brochure and condoms given. Kwadwo Teletha Petrea,RN.

## 2024-10-28 ENCOUNTER — Other Ambulatory Visit: Payer: Self-pay | Admitting: Family Medicine

## 2024-10-28 DIAGNOSIS — Z3049 Encounter for surveillance of other contraceptives: Secondary | ICD-10-CM

## 2024-10-28 DIAGNOSIS — Z3009 Encounter for other general counseling and advice on contraception: Secondary | ICD-10-CM

## 2024-10-28 NOTE — Telephone Encounter (Signed)
 Requested Prescriptions  Pending Prescriptions Disp Refills   etonogestrel -ethinyl estradiol  (NUVARING) 0.12-0.015 MG/24HR vaginal ring [Pharmacy Med Name: ETONOGESTREL -EE VAGINAL RING] 3 each 4    Sig: INSERT 1 RING VAGINALLY AS DIRECTED. REMOVE AFTER 3 WEEKS & WAIT 7 DAYS BEFORE INSERTING A NEW RING     OB/GYN:  Contraceptives Passed - 10/28/2024  4:46 PM      Passed - Last BP in normal range    BP Readings from Last 1 Encounters:  08/01/24 126/82         Passed - Valid encounter within last 12 months    Recent Outpatient Visits           2 months ago Well adult exam   [facility] Glenard Mire, MD   6 months ago Myasthenia gravis [facility])   [place] [facility] Glenard Mire, MD              Passed - Patient is not a smoker

## 2025-02-02 ENCOUNTER — Ambulatory Visit: Admitting: Family Medicine

## 2025-05-07 ENCOUNTER — Ambulatory Visit: Admitting: Family Medicine

## 2025-07-03 ENCOUNTER — Ambulatory Visit

## 2025-08-05 ENCOUNTER — Encounter: Admitting: Family Medicine
